# Patient Record
Sex: Female | Born: 1940 | Race: Black or African American | Hispanic: No | Smoking: Never smoker
Health system: Southern US, Community
[De-identification: ages and names within clinical notes are randomized; demographics above are authoritative.]

## PROBLEM LIST (undated history)

## (undated) DIAGNOSIS — E7800 Pure hypercholesterolemia, unspecified: Secondary | ICD-10-CM

## (undated) DIAGNOSIS — I10 Essential (primary) hypertension: Secondary | ICD-10-CM

## (undated) DIAGNOSIS — Z800 Family history of malignant neoplasm of digestive organs: Secondary | ICD-10-CM

## (undated) DIAGNOSIS — R011 Cardiac murmur, unspecified: Secondary | ICD-10-CM

## (undated) HISTORY — DX: Cardiac murmur, unspecified: R01.1

## (undated) HISTORY — PX: NO PAST SURGERIES: SHX2092

## (undated) HISTORY — DX: Essential (primary) hypertension: I10

## (undated) HISTORY — DX: Family history of malignant neoplasm of digestive organs: Z80.0

---

## 2007-10-11 ENCOUNTER — Emergency Department (HOSPITAL_COMMUNITY): Admission: EM | Admit: 2007-10-11 | Discharge: 2007-10-11 | Payer: Self-pay | Admitting: Emergency Medicine

## 2008-10-18 ENCOUNTER — Ambulatory Visit: Payer: Self-pay | Admitting: Occupational Medicine

## 2013-07-11 LAB — LIPID PANEL
CHOLESTEROL: 267 mg/dL — AB (ref 0–200)
HDL: 107 mg/dL — AB (ref 35–70)
LDL Cholesterol: 152 mg/dL
Triglycerides: 42 mg/dL (ref 40–160)

## 2013-07-11 LAB — BASIC METABOLIC PANEL
Creatinine: 1.1 mg/dL (ref 0.5–1.1)
Glucose: 82 mg/dL
Sodium: 4 mmol/L — AB (ref 137–147)

## 2013-07-11 LAB — HEPATIC FUNCTION PANEL
ALT: 20 U/L (ref 7–35)
AST: 21 U/L (ref 13–35)

## 2013-07-11 LAB — DIET LOW PHOSPHORUS
Phosphorus: 3.5 mg/dL (ref 2.5–4.9)
Vit D, 25-Hydroxy: 22.69

## 2013-07-11 LAB — TSH: TSH: 1.72 u[IU]/mL (ref 0.41–5.90)

## 2013-07-11 LAB — CBC AND DIFFERENTIAL
HEMOGLOBIN: 13.5 g/dL (ref 12.0–16.0)
Platelets: 291 10*3/uL (ref 150–399)
WBC: 4.4 10^3/mL

## 2013-07-11 LAB — HEMOGLOBIN A1C: Hgb A1c MFr Bld: 6.1 % — AB (ref 4.0–6.0)

## 2013-08-18 ENCOUNTER — Ambulatory Visit (INDEPENDENT_AMBULATORY_CARE_PROVIDER_SITE_OTHER): Payer: Federal, State, Local not specified - PPO | Admitting: Family Medicine

## 2013-08-18 ENCOUNTER — Encounter: Payer: Self-pay | Admitting: Family Medicine

## 2013-08-18 VITALS — BP 141/64 | HR 78 | Ht 67.0 in | Wt 198.0 lb

## 2013-08-18 DIAGNOSIS — R7303 Prediabetes: Secondary | ICD-10-CM | POA: Insufficient documentation

## 2013-08-18 DIAGNOSIS — E785 Hyperlipidemia, unspecified: Secondary | ICD-10-CM | POA: Insufficient documentation

## 2013-08-18 DIAGNOSIS — I10 Essential (primary) hypertension: Secondary | ICD-10-CM

## 2013-08-18 DIAGNOSIS — N183 Chronic kidney disease, stage 3 unspecified: Secondary | ICD-10-CM | POA: Insufficient documentation

## 2013-08-18 DIAGNOSIS — E039 Hypothyroidism, unspecified: Secondary | ICD-10-CM | POA: Insufficient documentation

## 2013-08-18 DIAGNOSIS — R7309 Other abnormal glucose: Secondary | ICD-10-CM

## 2013-08-18 HISTORY — DX: Essential (primary) hypertension: I10

## 2013-08-18 MED ORDER — LISINOPRIL 20 MG PO TABS: ORAL_TABLET | ORAL | Status: DC

## 2013-08-18 MED ORDER — PRAVASTATIN SODIUM 40 MG PO TABS: 40.0000 mg | ORAL_TABLET | Freq: Every evening | ORAL | Status: DC

## 2013-08-18 NOTE — Progress Notes (Signed)
CC: Felicia Yu is a 73 y.o. female is here for Establish Care   Subjective: HPI:  Pleasant and animated 73-year-old here to establish care  Patient reports a history of stage III chronic kidney disease that was brought to her attention in December of last year. She asked her physician if she should start on a medication such as lisinopril to help control this and believes her former physician dismissed her concern.  She denies any intolerance to ACE inhibitors or angiotensin receptor blockers. She had a metabolic panel obtained in December confirming stage III kidney disease with a normal potassium. Phosphorus was checked and was normal.  Intact parathyroid hormone was slightly elevated. Calcium was normal as were all other electrolytes.  She reports a history of hypertension she's unsure how long she's had this condition but she is taking amlodipine 10 mg on a daily basis. She denies any known side effects.  She reports a history of hyperlipidemia and has been taking lovastatin on a daily basis without right upper quadrant pain nor myalgias. She had a lipid panel obtained on December showing an LDL of 152. She exercises most days of the week and tries to watch what she eats. She wants note there is a different statin if she can take which has low risk of side effects  She reports a history of hypothyroidism taking 50 mcg of levothyroxine on a daily basis she has free T4, free T3, TSH obtained in December all of which were normal.  She brings in labs show an A1c that was obtained in December at a level of 6.1. She denies polyuria polyphasia or polydipsia nor ever being told she was a diabetic.   Review of Systems - General ROS: negative for - chills, fever, night sweats, weight gain or weight loss Ophthalmic ROS: negative for - decreased vision Psychological ROS: negative for - anxiety or depression ENT ROS: negative for - hearing change, nasal congestion, tinnitus or  allergies Hematological and Lymphatic ROS: negative for - bleeding problems, bruising or swollen lymph nodes Breast ROS: negative Respiratory ROS: no cough, shortness of breath, or wheezing Cardiovascular ROS: no chest pain or dyspnea on exertion Gastrointestinal ROS: no abdominal pain, change in bowel habits, or black or bloody stools Genito-Urinary ROS: negative for - genital discharge, genital ulcers, incontinence or abnormal bleeding from genitals Musculoskeletal ROS: negative for - joint pain or muscle pain Neurological ROS: negative for - headaches or memory loss Dermatological ROS: negative for lumps, mole changes, rash and skin lesion changes  Past Medical History  Diagnosis Date  . Essential hypertension, benign 08/18/2013     History reviewed. No pertinent family history.   History  Substance Use Topics  . Smoking status: Not on file  . Smokeless tobacco: Not on file  . Alcohol Use: Not on file     Objective: Filed Vitals:   08/18/13 1413  BP: 141/64  Pulse: 78    General: Alert and Oriented, No Acute Distress HEENT: Pupils equal, round, reactive to light. Conjunctivae clear.  Moist mucous membranes pharynx unremarkable Lungs: Clear and comfortable work of breathing Cardiac: Regular rate and rhythm.  Neuro: Cranial nerves 2-12 grossly intact Extremities: No peripheral edema.  Strong peripheral pulses.  Mental Status: No depression, anxiety, nor agitation. Skin: Warm and dry.  Assessment & Plan: Felicia was seen today for establish care.  Diagnoses and associated orders for this visit:  Essential hypertension, benign - lisinopril (PRINIVIL,ZESTRIL) 20 MG tablet; One tablet by mouth daily for blood  pressure control.  CKD (chronic kidney disease) stage 3, GFR 30-59 ml/min - lisinopril (PRINIVIL,ZESTRIL) 20 MG tablet; One tablet by mouth daily for blood pressure control.  Hyperlipidemia - pravastatin (PRAVACHOL) 40 MG tablet; Take 1 tablet (40 mg total) by  mouth every evening.  Prediabetes  Hypothyroid    Essential hypertension: Uncontrolled we will add lisinopril, I've asked her to continue on amlodipine however she politely states that she would rather see if lisinopril alone can control her blood pressure  Chronic kidney disease: I think would be a great idea for her to be on lisinopril as long as it does not acutely impair her kidney function or cause hyperkalemia, starting 20 mg return in 3-4 weeks for repeat blood pressure and for metabolic panel Hyperlipidemia: Uncontrolled, discussed the pravastatin would have lowest chance of myalgias therefore start today stop lovastatin rechecking lipid panel 3 months Prediabetes: We'll check A1c on a three-month basis due in 2 months Hypothyroidism: Controlled continue levothyroxine   Return in about 4 weeks (around 09/15/2013) for BP Follow up.

## 2013-08-19 ENCOUNTER — Encounter: Payer: Self-pay | Admitting: *Deleted

## 2013-09-15 ENCOUNTER — Ambulatory Visit: Payer: Federal, State, Local not specified - PPO | Admitting: Family Medicine

## 2013-09-29 ENCOUNTER — Ambulatory Visit: Payer: Federal, State, Local not specified - PPO | Admitting: Family Medicine

## 2015-02-09 ENCOUNTER — Telehealth: Payer: Self-pay | Admitting: Family Medicine

## 2015-02-09 NOTE — Telephone Encounter (Signed)
Patient needs refill on lisinopril 20 mg.  She has appt next week on 7/15 with Dr Ivan Anchorshommel for follow up.  Can you send in a refill till she can come in?  Thanks  [facility] [address] in [place]

## 2015-02-10 NOTE — Telephone Encounter (Signed)
Pt has not been seen in a year so she would need an appt. Left message on vm

## 2015-02-19 ENCOUNTER — Encounter: Payer: Self-pay | Admitting: Family Medicine

## 2015-02-19 ENCOUNTER — Ambulatory Visit (INDEPENDENT_AMBULATORY_CARE_PROVIDER_SITE_OTHER): Payer: Federal, State, Local not specified - PPO | Admitting: Family Medicine

## 2015-02-19 VITALS — BP 162/77 | HR 69 | Wt 195.0 lb

## 2015-02-19 DIAGNOSIS — E039 Hypothyroidism, unspecified: Secondary | ICD-10-CM

## 2015-02-19 DIAGNOSIS — R7309 Other abnormal glucose: Secondary | ICD-10-CM

## 2015-02-19 DIAGNOSIS — I10 Essential (primary) hypertension: Secondary | ICD-10-CM | POA: Diagnosis not present

## 2015-02-19 DIAGNOSIS — R7303 Prediabetes: Secondary | ICD-10-CM

## 2015-02-19 DIAGNOSIS — N183 Chronic kidney disease, stage 3 unspecified: Secondary | ICD-10-CM

## 2015-02-19 NOTE — Progress Notes (Signed)
CC: Felicia Yu is a 74 y.o. female is here for Hypertension and Hyperlipidemia   Subjective: HPI:  Follow-up essential hypertension: She is currently taking both amlodipine and lisinopril. Since starting lisinopril she denies any cough or angioedema nor any other known side effects. Denies chest pain shortness of breath nor peripheral edema. No outside blood pressures to report  Follow-up chronic kidney disease: Denies any edema or muscle cramping nor confusion.  Follow-up hypothyroidism: Currently on 50 g of levothyroxine on a daily basis. No unintentional weight loss or gain. No cast or intestinal skin or hair complaints  Follow-up prediabetes: No outside blood sugars to report. Denies polyuria polyphagia or polydipsia. Admits she could do better with physical activity but tries to watch what she eats   Review Of Systems Outlined In HPI  Past Medical History  Diagnosis Date  . Essential hypertension, benign 08/18/2013    No past surgical history on file. Family History  Problem Relation Age of Onset  . Diabetes    . Hyperlipidemia    . Hypertension      History   Social History  . Marital Status: Divorced    Spouse Name: N/A  . Number of Children: N/A  . Years of Education: N/A   Occupational History  . Not on file.   Social History Main Topics  . Smoking status: Never Smoker   . Smokeless tobacco: Not on file  . Alcohol Use: No  . Drug Use: No  . Sexual Activity: No   Other Topics Concern  . Not on file   Social History Narrative     Objective: BP 162/77 mmHg  Pulse 69  Wt 195 lb (88.451 kg)  General: Alert and Oriented, No Acute Distress HEENT: Pupils equal, round, reactive to light. Conjunctivae clear. Moist mucous membranes Lungs: Clear to auscultation bilaterally, no wheezing/ronchi/rales.  Comfortable work of breathing. Good air movement. Cardiac: Regular rate and rhythm. Normal S1/S2.  No murmurs, rubs, nor gallops.   Abdomen: Obese and  soft Extremities: No peripheral edema.  Strong peripheral pulses.  Mental Status: No depression, anxiety, nor agitation. Skin: Warm and dry.  Assessment & Plan: Felicia was seen today for hypertension and hyperlipidemia.  Diagnoses and all orders for this visit:  Essential hypertension, benign Orders: -     Lipid panel  CKD (chronic kidney disease) stage 3, GFR 30-59 ml/min Orders: -     BASIC METABOLIC PANEL WITH GFR  Hypothyroidism, unspecified hypothyroidism type Orders: -     TSH  Prediabetes Orders: -     Hemoglobin A1c   Essential hypertension: Uncontrolled chronic condition, I would like to go up on lisinopril however I like to measure her renal function first. Chronic kidney disease: Checking renal function and electrolytes Hypothyroidism: Clinically controlled but due for TSH , continue daily levothyroxine pending results Prediabetes: Clinically controlled to for repeat A1c   Return in about 3 months (around 05/22/2015).

## 2015-02-20 LAB — HEMOGLOBIN A1C
Hgb A1c MFr Bld: 5.7 % — ABNORMAL HIGH (ref <5.7)
Mean Plasma Glucose: 117 mg/dL — ABNORMAL HIGH (ref <117)

## 2015-02-20 LAB — TSH: TSH: 2.738 u[IU]/mL (ref 0.350–4.500)

## 2015-02-20 LAB — BASIC METABOLIC PANEL WITH GFR
BUN: 17 mg/dL (ref 6–23)
CO2: 29 mEq/L (ref 19–32)
CREATININE: 1.02 mg/dL (ref 0.50–1.10)
Calcium: 9 mg/dL (ref 8.4–10.5)
Chloride: 102 mEq/L (ref 96–112)
GFR, EST NON AFRICAN AMERICAN: 55 mL/min — AB
GFR, Est African American: 64 mL/min
Glucose, Bld: 81 mg/dL (ref 70–99)
POTASSIUM: 4.1 meq/L (ref 3.5–5.3)
Sodium: 139 mEq/L (ref 135–145)

## 2015-02-20 LAB — LIPID PANEL
CHOL/HDL RATIO: 2.7 ratio
CHOLESTEROL: 254 mg/dL — AB (ref 0–200)
HDL: 93 mg/dL (ref >=46)
LDL CALC: 149 mg/dL — AB (ref 0–99)
Triglycerides: 62 mg/dL (ref <150)
VLDL: 12 mg/dL (ref 0–40)

## 2015-02-22 ENCOUNTER — Telehealth: Payer: Self-pay | Admitting: Family Medicine

## 2015-02-22 DIAGNOSIS — N183 Chronic kidney disease, stage 3 unspecified: Secondary | ICD-10-CM

## 2015-02-22 DIAGNOSIS — I10 Essential (primary) hypertension: Secondary | ICD-10-CM

## 2015-02-22 MED ORDER — LEVOTHYROXINE SODIUM 50 MCG PO TABS: 50.0000 ug | ORAL_TABLET | Freq: Every day | ORAL | Status: DC

## 2015-02-22 MED ORDER — ATORVASTATIN CALCIUM 20 MG PO TABS: 20.0000 mg | ORAL_TABLET | Freq: Every day | ORAL | Status: DC

## 2015-02-22 MED ORDER — LISINOPRIL 30 MG PO TABS: ORAL_TABLET | ORAL | Status: DC

## 2015-02-22 NOTE — Telephone Encounter (Signed)
Left message on vm

## 2015-02-22 NOTE — Telephone Encounter (Signed)
Felicia Yu, Will you please let patient know that her thyroid supplement appears to be adequate therefore I've sent in refills of her levothyroxine to [facility].  Also Her A1c remains in the prediabetic range but improved compared to last year.  Her cholesterol was elevated to a degree where I'd strongly recommend switching from pravastatin to atorvastatin.  A higher dose of lisinopril was also sent in.  I'd recommend f/u in one month.

## 2015-03-10 ENCOUNTER — Ambulatory Visit (INDEPENDENT_AMBULATORY_CARE_PROVIDER_SITE_OTHER): Payer: Federal, State, Local not specified - PPO | Admitting: Family Medicine

## 2015-03-10 ENCOUNTER — Encounter: Payer: Self-pay | Admitting: Family Medicine

## 2015-03-10 VITALS — BP 167/75 | HR 74 | Ht 67.0 in | Wt 179.0 lb

## 2015-03-10 DIAGNOSIS — K130 Diseases of lips: Secondary | ICD-10-CM

## 2015-03-10 DIAGNOSIS — R202 Paresthesia of skin: Secondary | ICD-10-CM

## 2015-03-10 DIAGNOSIS — R220 Localized swelling, mass and lump, head: Secondary | ICD-10-CM | POA: Insufficient documentation

## 2015-03-10 NOTE — Assessment & Plan Note (Signed)
Left lower lip swelling. Unclear etiology. Patient does take an ACE inhibitor this could possibly be angioedema. As patient is going to the emergency room anyway this issue can be followed.

## 2015-03-10 NOTE — Progress Notes (Signed)
Felicia Yu is a 74 y.o. female who presents to [facility]  today for "stroke".  Patient awoke this afternoon with swelling of her left lower lip, subjective blurry vision in her left eye, left arm feeling cold and numb, and left facial and head decreased sensation compared to the right. She notes that her sister had a stroke 2 weeks ago and she is concerned that she is having a stroke today. She denies any weakness or loss of function or trouble with speech or swallow. She denies any history of stroke in her personal medical history. No vomiting diarrhea chest pains or palpitations.   Past Medical History  Diagnosis Date  . Essential hypertension, benign 08/18/2013   No past surgical history on file. History  Substance Use Topics  . Smoking status: Never Smoker   . Smokeless tobacco: Not on file  . Alcohol Use: No   ROS as above Medications: Current Outpatient Prescriptions  Medication Sig Dispense Refill  . amLODipine (NORVASC) 5 MG tablet Take 5 mg by mouth daily.    Marland Kitchen atorvastatin (LIPITOR) 20 MG tablet Take 1 tablet (20 mg total) by mouth daily. 90 tablet 1  . levothyroxine (SYNTHROID, LEVOTHROID) 50 MCG tablet Take 1 tablet (50 mcg total) by mouth daily before breakfast. 90 tablet 1  . lisinopril (PRINIVIL,ZESTRIL) 30 MG tablet One tablet by mouth daily for blood pressure control. 30 tablet 2   No current facility-administered medications for this visit.   No Known Allergies   Exam:  BP 167/75 mmHg  Pulse 74  Ht  (1.702 m)  Wt 179 lb (81.194 kg)  BMI 28.03 kg/m2 Gen: Well NAD HEENT: EOMI,  MMM mild left lower lip swelling. No tongue swelling. Lungs: Normal work of breathing. CTABL Heart: RRR no MRG Abd: NABS, Soft. Nondistended, Nontender Exts: Brisk capillary refill, warm and well perfused.  Neuro: Alert and oriented cranial nerves II through XII are intact. Mild subjective decreased sensation left face. Upper extremity  and lower extremity reflexes are equal and normal bilaterally. Normal strength and gait. Psych: Alert and oriented normal affect and speech is slightly pressured. Patient is slightly tangential.  Visual Acuity:  Right Eye Distance:   20/30 Left Eye Distance:   20/70 Bilateral Distance:     No results found for this or any previous visit (from the past 24 hour(s)). No results found.   Please see individual assessment and plan sections.

## 2015-03-10 NOTE — Assessment & Plan Note (Signed)
Patient has symptoms likely are simple paresthesias. However she has risk factors for renovascular disease. I feel is reasonable for her to go to the emergency room for workup for possible TIA. Patient refused EKG and EMS transport. She will drive herself. We warned of the risk for death or disability to herself or to others by driving a car with the possibility of having a current stroke.

## 2015-03-11 ENCOUNTER — Telehealth: Payer: Self-pay | Admitting: *Deleted

## 2015-03-11 MED ORDER — LOSARTAN POTASSIUM 100 MG PO TABS: 100.0000 mg | ORAL_TABLET | Freq: Every day | ORAL | Status: DC

## 2015-03-11 NOTE — Telephone Encounter (Signed)
Pt notified of new rx.

## 2015-03-11 NOTE — Telephone Encounter (Signed)
Pt left vm stating that she was informed in the ED yesterday that all her "stroke" sx were an allergice reaction to her lisinopril.  She would like for you to call her something else in to her pharm.

## 2015-03-11 NOTE — Telephone Encounter (Signed)
New Rx of losartan sent to [facility] in [place].  This replaces lisinopril which should be discontinued.

## 2015-11-22 ENCOUNTER — Other Ambulatory Visit: Payer: Self-pay | Admitting: Family Medicine

## 2017-01-09 ENCOUNTER — Encounter (HOSPITAL_BASED_OUTPATIENT_CLINIC_OR_DEPARTMENT_OTHER): Payer: Self-pay | Admitting: *Deleted

## 2017-01-09 ENCOUNTER — Emergency Department (HOSPITAL_BASED_OUTPATIENT_CLINIC_OR_DEPARTMENT_OTHER)
Admission: EM | Admit: 2017-01-09 | Discharge: 2017-01-09 | Disposition: A | Discharge status: Home / Self Care | Payer: Federal, State, Local not specified - PPO | Attending: Physician Assistant | Admitting: Physician Assistant

## 2017-01-09 DIAGNOSIS — I10 Essential (primary) hypertension: Secondary | ICD-10-CM | POA: Diagnosis not present

## 2017-01-09 DIAGNOSIS — Z79899 Other long term (current) drug therapy: Secondary | ICD-10-CM | POA: Diagnosis not present

## 2017-01-09 HISTORY — DX: Pure hypercholesterolemia, unspecified: E78.00

## 2017-01-09 LAB — CBC WITH DIFFERENTIAL/PLATELET
Basophils Absolute: 0 10*3/uL (ref 0.0–0.1)
Basophils Relative: 0 %
EOS PCT: 1 %
Eosinophils Absolute: 0.1 10*3/uL (ref 0.0–0.7)
HCT: 40.8 % (ref 36.0–46.0)
Hemoglobin: 13.3 g/dL (ref 12.0–15.0)
LYMPHS ABS: 2.6 10*3/uL (ref 0.7–4.0)
Lymphocytes Relative: 45 %
MCH: 28.9 pg (ref 26.0–34.0)
MCHC: 32.6 g/dL (ref 30.0–36.0)
MCV: 88.7 fL (ref 78.0–100.0)
MONOS PCT: 6 %
Monocytes Absolute: 0.3 10*3/uL (ref 0.1–1.0)
Neutro Abs: 2.7 10*3/uL (ref 1.7–7.7)
Neutrophils Relative %: 48 %
PLATELETS: 227 10*3/uL (ref 150–400)
RBC: 4.6 MIL/uL (ref 3.87–5.11)
RDW: 14 % (ref 11.5–15.5)
WBC: 5.7 10*3/uL (ref 4.0–10.5)

## 2017-01-09 LAB — BASIC METABOLIC PANEL
Anion gap: 7 (ref 5–15)
BUN: 18 mg/dL (ref 6–20)
CALCIUM: 9.3 mg/dL (ref 8.9–10.3)
CO2: 28 mmol/L (ref 22–32)
CREATININE: 0.93 mg/dL (ref 0.44–1.00)
Chloride: 106 mmol/L (ref 101–111)
GFR calc Af Amer: >60 mL/min (ref >60)
GFR, EST NON AFRICAN AMERICAN: 59 mL/min — AB (ref >60)
GLUCOSE: 87 mg/dL (ref 65–99)
Potassium: 3.6 mmol/L (ref 3.5–5.1)
SODIUM: 141 mmol/L (ref 135–145)

## 2017-01-09 LAB — TROPONIN I: Troponin I: <0.03 ng/mL (ref <0.03)

## 2017-01-09 NOTE — Discharge Instructions (Signed)
We're reassured by her normal vital signs normal labs. Please return as needed. Please follow-up with her primary care physician.

## 2017-01-09 NOTE — ED Provider Notes (Signed)
[facility] Note   CSN: [id number] Arrival date & time: 01/09/17  1921  By signing my name below, I, Diona BrownerJennifer Gorman, attest that this documentation has been prepared under the direction and in the presence of Jaimere Feutz, Cindee Saltourteney Lyn, MD. Electronically Signed: Diona BrownerJennifer Gorman, ED Scribe. 01/09/17. 8:13 PM.  History   Chief Complaint Chief Complaint  Patient presents with  . Hypertension    HPI Felicia Yu is a 76 y.o. female with a PMHx of HTN who presents to the Emergency Department complaining of constant elevated BP for the last week. She goes to [facility] regularly to check her BP, since [place] P[place] in [place] used [place] live. Pt notes she thought she had a moment of tingling in her right leg. She goes on walks regularly. FHx of heart disease. Pt denies fever, bilateral leg edema, and CP.  The history is provided by the patient. No language interpreter was used.    Past Medical History:  Diagnosis Date  . High cholesterol   . Hypertension     There are no active problems to display for this patient.   History reviewed. No pertinent surgical history.  OB History    No data available       Home Medications    Prior to Admission medications   Medication Sig Start Date End Date Taking? Authorizing Provider  atorvastatin (LIPITOR) 20 MG tablet Take 20 mg by mouth daily.   Yes [provider]    Family History No family history on file.  Social History Social History  Substance Use Topics  . Smoking status: Never Smoker  . Smokeless tobacco: Never Used  . Alcohol use No     Allergies   Patient has no known allergies.   Review of Systems Review of Systems  Constitutional: Negative for fever.  Cardiovascular: Negative for chest pain.  All other systems reviewed and are negative.    Physical Exam Updated Vital Signs BP (!) 192/96   Pulse 78   Temp 98.7 F (37.1 C) (Oral)   Resp 20   Ht 5\' 4"  (1.626 m)    Wt 180 lb (81.6 kg)   SpO2 100%   BMI 30.90 kg/m   Physical Exam  Constitutional: She is oriented to person, place, and time. She appears well-developed and well-nourished.  HENT:  Head: Normocephalic and atraumatic.  Eyes: Conjunctivae and EOM are normal. Pupils are equal, round, and reactive to light.  Neck: Normal range of motion.  Cardiovascular: Normal rate, regular rhythm, normal heart sounds and intact distal pulses.   Pulmonary/Chest: Effort normal and breath sounds normal.  Abdominal: Soft. Bowel sounds are normal. She exhibits no distension.  Musculoskeletal: Normal range of motion. She exhibits no edema.  Neurological: She is alert and oriented to person, place, and time.  Skin: Skin is warm and dry.  Psychiatric: She has a normal mood and affect.  Nursing note and vitals reviewed.    ED Treatments / Results  DIAGNOSTIC STUDIES: Oxygen Saturation is 100% on RA, normal by my interpretation.   COORDINATION OF CARE: 8:13 PM-Discussed next steps with pt which includes following up with a local PCP. Pt verbalized understanding and is agreeable with the plan.   Labs (all labs ordered are listed, but only abnormal results are displayed) Labs Reviewed  CBC WITH DIFFERENTIAL/PLATELET  TROPONIN I  BASIC METABOLIC PANEL    EKG  EKG Interpretation  Date/Time:  Tuesday January 09 2017 19:40:54 EDT Ventricular Rate:  76 PR Interval:    QRS Duration: 87 QT Interval:  382 QTC Calculation: 430 R Axis:   42 Text Interpretation:  Sinus rhythm Baseline wander in lead(s) V4 Normal sinus rhythm Confirmed by Corlis Leak, Haeden Hudock ([id number]) on 01/09/2017 8:03:12 PM       Radiology No results found.  Procedures Procedures (including critical care time)  Medications Ordered in ED Medications - No data to display   Initial Impression / Assessment and Plan / ED Course  I have reviewed the triage vital signs and the nursing notes.  Pertinent labs & imaging results that were  available during my care of the patient were reviewed by me and considered in my medical decision making (see chart for details).     I personally performed the services described in this documentation, which was scribed in my presence. The recorded information has been reviewed and is accurate.   Very well-appearing 76 year old female with history of hypertension hyperlipidemia presenting with hypertension. Patient has been concerned because she's been taking her blood pressure at [facility] and it's been elevated. Patient had no chest pain. We discussed the need to follow-up with a primary care physician in the area. We'll give her follow-up information. Patient's physical exam and labs are reassuring.    Final Clinical Impressions(s) / ED Diagnoses   Final diagnoses:  None    New Prescriptions New Prescriptions   No medications on file     Abelino Derrick, MD 01/09/17 2028

## 2017-01-09 NOTE — ED Triage Notes (Signed)
States she was Felicia Yu and took her BP. It was elevated. Tingling in her right leg x 20 minutes.

## 2017-01-18 ENCOUNTER — Encounter: Payer: Self-pay | Admitting: Family Medicine

## 2017-01-18 ENCOUNTER — Ambulatory Visit (INDEPENDENT_AMBULATORY_CARE_PROVIDER_SITE_OTHER): Payer: Federal, State, Local not specified - PPO | Admitting: Family Medicine

## 2017-01-18 VITALS — BP 200/90 | HR 73 | Temp 98.0°F | Ht 64.0 in | Wt 189.6 lb

## 2017-01-18 DIAGNOSIS — R7303 Prediabetes: Secondary | ICD-10-CM | POA: Diagnosis not present

## 2017-01-18 DIAGNOSIS — I10 Essential (primary) hypertension: Secondary | ICD-10-CM | POA: Diagnosis not present

## 2017-01-18 DIAGNOSIS — E039 Hypothyroidism, unspecified: Secondary | ICD-10-CM | POA: Diagnosis not present

## 2017-01-18 DIAGNOSIS — R011 Cardiac murmur, unspecified: Secondary | ICD-10-CM

## 2017-01-18 DIAGNOSIS — E785 Hyperlipidemia, unspecified: Secondary | ICD-10-CM

## 2017-01-18 HISTORY — DX: Cardiac murmur, unspecified: R01.1

## 2017-01-18 LAB — COMPREHENSIVE METABOLIC PANEL
ALT: 11 U/L (ref 0–35)
AST: 21 U/L (ref 0–37)
Albumin: 3.8 g/dL (ref 3.5–5.2)
Alkaline Phosphatase: 72 U/L (ref 39–117)
BUN: 15 mg/dL (ref 6–23)
CALCIUM: 9.2 mg/dL (ref 8.4–10.5)
CHLORIDE: 106 meq/L (ref 96–112)
CO2: 30 meq/L (ref 19–32)
Creatinine, Ser: 1.05 mg/dL (ref 0.40–1.20)
GFR: 65.82 mL/min (ref >60.00)
GLUCOSE: 88 mg/dL (ref 70–99)
Potassium: 3.6 mEq/L (ref 3.5–5.1)
Sodium: 142 mEq/L (ref 135–145)
Total Bilirubin: 0.6 mg/dL (ref 0.2–1.2)
Total Protein: 6.7 g/dL (ref 6.0–8.3)

## 2017-01-18 LAB — TSH: TSH: 2.85 u[IU]/mL (ref 0.35–4.50)

## 2017-01-18 LAB — LIPID PANEL
CHOL/HDL RATIO: 2
Cholesterol: 197 mg/dL (ref 0–200)
HDL: 87.4 mg/dL (ref >39.00)
LDL CALC: 100 mg/dL — AB (ref 0–99)
NONHDL: 109.95
TRIGLYCERIDES: 51 mg/dL (ref 0.0–149.0)
VLDL: 10.2 mg/dL (ref 0.0–40.0)

## 2017-01-18 LAB — CBC
HEMATOCRIT: 38.5 % (ref 36.0–46.0)
HEMOGLOBIN: 12.5 g/dL (ref 12.0–15.0)
MCHC: 32.6 g/dL (ref 30.0–36.0)
MCV: 89 fl (ref 78.0–100.0)
PLATELETS: 228 10*3/uL (ref 150.0–400.0)
RBC: 4.32 Mil/uL (ref 3.87–5.11)
RDW: 15.3 % (ref 11.5–15.5)
WBC: 3.7 10*3/uL — ABNORMAL LOW (ref 4.0–10.5)

## 2017-01-18 LAB — T4, FREE: FREE T4: 0.71 ng/dL (ref 0.60–1.60)

## 2017-01-18 LAB — HEMOGLOBIN A1C: Hgb A1c MFr Bld: 5.6 % (ref 4.6–6.5)

## 2017-01-18 MED ORDER — AMLODIPINE BESYLATE 5 MG PO TABS: 5.0000 mg | ORAL_TABLET | Freq: Every day | ORAL | 5 refills | Status: DC

## 2017-01-18 NOTE — Progress Notes (Signed)
Chief Complaint  Patient presents with  . Establish Care    pt want to discuss BP meds       New Patient Visit SUBJECTIVE: HPI: Felicia Yu is an 76 y.o.female who is being seen for establishing care.  The patient was previously seen at Dr. Genelle Bal office before he left.  Hypertension Patient presents for hypertension follow up. She does not routinely monitor home blood pressures. She is not compliant with medications- Cozaar 50 mg daily instead of 100 mg daily. She used to be on Norvasc, and does not remember why she stopped taking it.  Patient has these side effects of medication: none She is adhering to a healthy diet overall. Exercise: walking daily Denies chest pain, palpitations, or SOB. She does not snore at night and denies daytime fatigue.  Allergies  Allergen Reactions  . Lisinopril Other (See Comments)    Facial edema & blurred vision    Past Medical History:  Diagnosis Date  . Essential hypertension, benign 08/18/2013  . Systolic ejection murmur 01/18/2017   Past Surgical History:  Procedure Laterality Date  . NO PAST SURGERIES     Social History   Social History  . Marital status: Divorced   Social History Main Topics  . Smoking status: Never Smoker  . Smokeless tobacco: Never Used  . Alcohol use No  . Drug use: No  . Sexual activity: No   Family History  Problem Relation Age of Onset  . Diabetes Unknown   . Hyperlipidemia Unknown   . Hypertension Unknown      Current Outpatient Prescriptions:  .  atorvastatin (LIPITOR) 80 MG tablet, Take 80 mg by mouth daily., Disp: , Rfl:  .  levothyroxine (SYNTHROID, LEVOTHROID) 50 MCG tablet, Take 1 tablet (50 mcg total) by mouth daily before breakfast., Disp: 90 tablet, Rfl: 1 .  losartan (COZAAR) 100 MG tablet, Take 1 tablet (100 mg total) by mouth daily., Disp: 90 tablet, Rfl: 0 .  OVER THE COUNTER MEDICATION, Vitamin D3 800mg -Take 1 tablet by mouth twice a day., Disp: , Rfl:  .  amLODipine  (NORVASC) 5 MG tablet, Take 1 tablet (5 mg total) by mouth daily., Disp: 30 tablet, Rfl: 5  No LMP recorded. Patient is postmenopausal.  ROS Const: no fevers Eyes: no vision changes Nose: no nosebleeds Throat: no trouble swallowing Neck: No masses in neck Endo: No unexpected weight changes Heart: No chest pain Lung: No SOB GI: No constipation GU: No urinary complaints Neuro: No numbness or tingling  OBJECTIVE: BP (!) 200/90 (BP Location: Left Arm, Patient Position: Sitting, Cuff Size: Normal) Comment: Patient has not taking BP medication this morning.  Pulse 73   Temp 98 F (36.7 C) (Oral)   Ht 5\' 4"  (1.626 m)   Wt 189 lb 9.6 oz (86 kg)   SpO2 99%   BMI 32.54 kg/m   Constitutional: -  VS reviewed -  Well developed, well nourished, appears stated age -  No apparent distress  Psychiatric: -  Oriented to person, place, and time -  Memory intact -  Affect and mood normal -  Fluent conversation, good eye contact -  Judgment and insight age appropriate  Eye: -  Conjunctivae clear, no discharge -  Pupils symmetric, round, reactive to light  ENMT: -  Oral mucosa without lesions, tongue and uvula midline    Tonsils not enlarged, no erythema, no exudate, trachea midline    Pharynx moist, no lesions, no erythema  Neck: -  No gross  swelling, no palpable masses -  Thyroid midline, not enlarged, mobile, no palpable masses  Cardiovascular: -  RRR, no murmurs -  No LE edema  Respiratory: -  Normal respiratory effort, no accessory muscle use, no retraction -  Breath sounds equal, no wheezes, no ronchi, no crackles  Gastrointestinal: -  Bowel sounds normal -  No tenderness, no distention, no guarding, no masses  Neurological:  -  CN II - XII grossly intact -  Sensation grossly intact to light touch, equal bilaterally  Skin: -  No significant lesion on inspection -  Warm and dry to palpation   ASSESSMENT/PLAN: Essential hypertension, benign - Plan: Comprehensive metabolic panel,  CBC, Aldosterone + renin activity w/ ratio, amLODipine (NORVASC) 5 MG tablet  Hypothyroidism, unspecified type - Plan: TSH, T4, free  Prediabetes - Plan: Hemoglobin A1c  Hyperlipidemia, unspecified hyperlipidemia type - Plan: Lipid panel  Systolic ejection murmur  Patient instructed to sign release of records form from her previous PCP. Check some labs, EKG obtained 2 weeks ago in ED WNL. Go to full dose of losartan, add back amlodipine. Home BP monitoring discussed. Counseled on diet and exercise. Given her concern, 2ndary causes of HTN explored in hx, will obtain renin and aldosterone.  Patient should return in 4 week, bring BP log. The patient voiced understanding and agreement to the plan.   Jilda Rocheicholas Paul DubachWendling, DO 01/18/17  1:13 PM

## 2017-01-18 NOTE — Patient Instructions (Addendum)
Give us 2-3 business days to get the results of your labs back.   Goal weight loss of 1/2 lb per week. Remember you promised me that you will lose 10 lbs by your visit in 4 weeks.  Around 3 times per week, check your blood pressure 4 times per day. Twice in the morning and twice in the evening. The readings should be at least one minute apart. Write down these values and bring them to your next nurse visit/appointment.  When you check your BP, make sure you have been doing something calm/relaxing 5 minutes prior to checking. Both feet should be flat on the floor and you should be sitting. Use your left arm and make sure it is in a relaxed position (on a table), and that the cuff is at the approximate level/height of your heart.

## 2017-01-22 ENCOUNTER — Encounter: Payer: Self-pay | Admitting: *Deleted

## 2017-01-22 LAB — ALDOSTERONE + RENIN ACTIVITY W/ RATIO
ALDO / PRA RATIO: 1.3 ratio (ref 0.9–28.9)
Aldosterone: 3 ng/dL
PRA LC/MS/MS: 2.32 ng/mL/h (ref 0.25–5.82)

## 2017-01-31 ENCOUNTER — Encounter: Payer: Self-pay | Admitting: Family Medicine

## 2017-02-15 ENCOUNTER — Encounter: Payer: Federal, State, Local not specified - PPO | Admitting: Family Medicine

## 2017-03-23 ENCOUNTER — Encounter: Payer: Federal, State, Local not specified - PPO | Admitting: Family Medicine

## 2017-04-06 ENCOUNTER — Encounter: Payer: Self-pay | Admitting: Family Medicine

## 2017-04-06 ENCOUNTER — Ambulatory Visit (INDEPENDENT_AMBULATORY_CARE_PROVIDER_SITE_OTHER): Payer: Federal, State, Local not specified - PPO | Admitting: Family Medicine

## 2017-04-06 VITALS — BP 142/88 | HR 75 | Temp 98.5°F | Ht 64.0 in | Wt 179.0 lb

## 2017-04-06 DIAGNOSIS — M8580 Other specified disorders of bone density and structure, unspecified site: Secondary | ICD-10-CM

## 2017-04-06 DIAGNOSIS — Z2821 Immunization not carried out because of patient refusal: Secondary | ICD-10-CM

## 2017-04-06 DIAGNOSIS — Z0000 Encounter for general adult medical examination without abnormal findings: Secondary | ICD-10-CM | POA: Diagnosis not present

## 2017-04-06 NOTE — Progress Notes (Signed)
Pre visit review using our clinic review tool, if applicable. No additional management support is needed unless otherwise documented below in the visit note. 

## 2017-04-06 NOTE — Patient Instructions (Addendum)
If you do not hear anything about your bone density scan in the next 1-2 weeks, call our office and ask for an update.   Let us know if you need anything.  Pneumococcal Conjugate Vaccine (PCV13) What You Need to Know 1. Why get vaccinated? Vaccination can protect both children and adults from pneumococcal disease. Pneumococcal disease is caused by bacteria that can spread from person to person through close contact. It can cause ear infections, and it can also lead to more serious infections of the:  Lungs (pneumonia),  Blood (bacteremia), and  Covering of the brain and spinal cord (meningitis).  Pneumococcal pneumonia is most common among adults. Pneumococcal meningitis can cause deafness and brain damage, and it kills about 1 child in 10 who get it. Anyone can get pneumococcal disease, but children under 26 years of age and adults 63 years and older, people with certain medical conditions, and cigarette smokers are at the highest risk. Before there was a vaccine, the [place] saw:  more than 700 cases of meningitis,  about 13,000 blood infections,  about 5 million ear infections, and  about 200 deaths  in children under 5 each year from pneumococcal disease. Since vaccine became available, severe pneumococcal disease in these children has fallen by 88%. About 18,000 older adults die of pneumococcal disease each year in the [place]. Treatment of pneumococcal infections with penicillin and other drugs is not as effective as it used to be, because some strains of the disease have become resistant to these drugs. This makes prevention of the disease, through vaccination, even more important. 2. PCV13 vaccine Pneumococcal conjugate vaccine (called PCV13) protects against 13 types of pneumococcal bacteria. PCV13 is routinely given to children at 2, 4, 6, and 90-60 months of age. It is also recommended for children and adults 32 to 28 years of age with certain health conditions,  and for all adults 22 years of age and older. Your doctor can give you details. 3. Some people should not get this vaccine Anyone who has ever had a life-threatening allergic reaction to a dose of this vaccine, to an earlier pneumococcal vaccine called PCV7, or to any vaccine containing diphtheria toxoid (for example, DTaP), should not get PCV13. Anyone with a severe allergy to any component of PCV13 should not get the vaccine. Tell your doctor if the person being vaccinated has any severe allergies. If the person scheduled for vaccination is not feeling well, your healthcare provider might decide to reschedule the shot on another day. 4. Risks of a vaccine reaction With any medicine, including vaccines, there is a chance of reactions. These are usually mild and go away on their own, but serious reactions are also possible. Problems reported following PCV13 varied by age and dose in the series. The most common problems reported among children were:  About half became drowsy after the shot, had a temporary loss of appetite, or had redness or tenderness where the shot was given.  About 1 out of 3 had swelling where the shot was given.  About 1 out of 3 had a mild fever, and about 1 in 20 had a fever over 102.77F.  Up to about 8 out of 10 became fussy or irritable.  Adults have reported pain, redness, and swelling where the shot was given; also mild fever, fatigue, headache, chills, or muscle pain. Young children who get PCV13 along with inactivated flu vaccine at the same time may be at increased risk for seizures caused by fever.  Ask your doctor for more information. Problems that could happen after any vaccine:  People sometimes faint after a medical procedure, including vaccination. Sitting or lying down for about 15 minutes can help prevent fainting, and injuries caused by a fall. Tell your doctor if you feel dizzy, or have vision changes or ringing in the ears.  Some older children and  adults get severe pain in the shoulder and have difficulty moving the arm where a shot was given. This happens very rarely.  Any medication can cause a severe allergic reaction. Such reactions from a vaccine are very rare, estimated at about 1 in a million doses, and would happen within a few minutes to a few hours after the vaccination. As with any medicine, there is a very small chance of a vaccine causing a serious injury or death. The safety of vaccines is always being monitored. For more information, visit: http://floyd.org/ 5. What if there is a serious reaction? What should I look for? Look for anything that concerns you, such as signs of a severe allergic reaction, very high fever, or unusual behavior. Signs of a severe allergic reaction can include hives, swelling of the face and throat, difficulty breathing, a fast heartbeat, dizziness, and weakness-usually within a few minutes to a few hours after the vaccination. What should I do?  If you think it is a severe allergic reaction or other emergency that can't wait, call 9-1-1 or get the person to the nearest hospital. Otherwise, call your doctor.  Reactions should be reported to the Vaccine Adverse Event Reporting System (VAERS). Your doctor should file this report, or you can do it yourself through the VAERS web site at www.vaers.LAgents.no, or by calling 1-831-250-9862. ? VAERS does not give medical advice. 6. The National Vaccine Injury Compensation Program The [employer] Vaccine Injury Compensation Program (VICP) is a federal program that was created to compensate people who may have been injured by certain vaccines. Persons who believe they may have been injured by a vaccine can learn about the program and about filing a claim by calling 1-575-797-5047 or visiting the VICP website at SpiritualWord.at. There is a time limit to file a claim for compensation. 7. How can I learn more?  Ask your healthcare provider.  He or she can give you the vaccine package insert or suggest other sources of information.  Call your local or state health department.  Contact the Centers for Disease Control and Prevention (CDC): ? Call 612-050-0039 (1-800-CDC-INFO) or ? Visit CDC's website at PicCapture.uy Vaccine Information Statement, PCV13 Vaccine (06/11/2014) This information is not intended to replace advice given to you by your health care provider. Make sure you discuss any questions you have with your health care provider. Document Released: 05/21/2006 Document Revised: 04/13/2016 Document Reviewed: 04/13/2016 Elsevier Interactive Patient Education  2017 [employer]. Tdap Vaccine (Tetanus, Diphtheria and Pertussis): What You Need to Know 1. Why get vaccinated? Tetanus, diphtheria and pertussis are very serious diseases. Tdap vaccine can protect [place] from these diseases. And, Tdap vaccine given to pregnant women can protect newborn babies against pertussis. TETANUS (Lockjaw) is rare in the [place] today. It causes painful muscle tightening and stiffness, usually all over the body.  It can lead to tightening of muscles in the head and neck so you can't open your mouth, swallow, or sometimes even breathe. Tetanus kills about 1 out of 10 people who are infected even after receiving the best medical care.  DIPHTHERIA is also rare in the [place] today.  It can cause a thick coating to form in the back of the throat.  It can lead to breathing problems, heart failure, paralysis, and death.  PERTUSSIS (Whooping Cough) causes severe coughing spells, which can cause difficulty breathing, vomiting and disturbed sleep.  It can also lead to weight loss, incontinence, and rib fractures. Up to 2 in 100 adolescents and 5 in 100 adults with pertussis are hospitalized or have complications, which could include pneumonia or death.  These diseases are caused by bacteria. Diphtheria and pertussis are spread from  person to person through secretions from coughing or sneezing. Tetanus enters the body through cuts, scratches, or wounds. Before vaccines, as many as 200,000 cases of diphtheria, 200,000 cases of pertussis, and hundreds of cases of tetanus, were reported in the [place] each year. Since vaccination began, reports of cases for tetanus and diphtheria have dropped by about 99% and for pertussis by about 80%. 2. Tdap vaccine Tdap vaccine can protect adolescents and adults from tetanus, diphtheria, and pertussis. One dose of Tdap is routinely given at age 104 or 62. People who did not get Tdap at that age should get it as soon as possible. Tdap is especially important for healthcare professionals and anyone having close contact with a baby younger than 12 months. Pregnant women should get a dose of Tdap during every pregnancy, to protect the newborn from pertussis. Infants are most at risk for severe, life-threatening complications from pertussis. Another vaccine, called Td, protects against tetanus and diphtheria, but not pertussis. A Td booster should be given every 10 years. Tdap may be given as one of these boosters if you have never gotten Tdap before. Tdap may also be given after a severe cut or burn to prevent tetanus infection. Your doctor or the person giving you the vaccine can give you more information. Tdap may safely be given at the same time as other vaccines. 3. Some people should not get this vaccine  A person who has ever had a life-threatening allergic reaction after a previous dose of any diphtheria, tetanus or pertussis containing vaccine, OR has a severe allergy to any part of this vaccine, should not get Tdap vaccine. Tell the person giving the vaccine about any severe allergies.  Anyone who had coma or long repeated seizures within 7 days after a childhood dose of DTP or DTaP, or a previous dose of Tdap, should not get Tdap, unless a cause other than the vaccine was found. They  can still get Td.  Talk to your doctor if you: ? have seizures or another nervous system problem, ? had severe pain or swelling after any vaccine containing diphtheria, tetanus or pertussis, ? ever had a condition called Guillain-Barr Syndrome (GBS), ? aren't feeling well on the day the shot is scheduled. 4. Risks With any medicine, including vaccines, there is a chance of side effects. These are usually mild and go away on their own. Serious reactions are also possible but are rare. Most people who get Tdap vaccine do not have any problems with it. Mild problems following Tdap: (Did not interfere with activities)  Pain where the shot was given (about 3 in 4 adolescents or 2 in 3 adults)  Redness or swelling where the shot was given (about 1 person in 5)  Mild fever of at least 100.40F (up to about 1 in 25 adolescents or 1 in 100 adults)  Headache (about 3 or 4 people in 10)  Tiredness (about 1 person in 3 or 4)  Nausea, vomiting, diarrhea, stomach ache (up to 1 in 4 adolescents or 1 in 10 adults)  Chills, sore joints (about 1 person in 10)  Body aches (about 1 person in 3 or 4)  Rash, swollen glands (uncommon)  Moderate problems following Tdap: (Interfered with activities, but did not require medical attention)  Pain where the shot was given (up to 1 in 5 or 6)  Redness or swelling where the shot was given (up to about 1 in 16 adolescents or 1 in 12 adults)  Fever over 102F (about 1 in 100 adolescents or 1 in 250 adults)  Headache (about 1 in 7 adolescents or 1 in 10 adults)  Nausea, vomiting, diarrhea, stomach ache (up to 1 or 3 people in 100)  Swelling of the entire arm where the shot was given (up to about 1 in 500).  Severe problems following Tdap: (Unable to perform usual activities; required medical attention)  Swelling, severe pain, bleeding and redness in the arm where the shot was given (rare).  Problems that could happen after any vaccine:  People  sometimes faint after a medical procedure, including vaccination. Sitting or lying down for about 15 minutes can help prevent fainting, and injuries caused by a fall. Tell your doctor if you feel dizzy, or have vision changes or ringing in the ears.  Some people get severe pain in the shoulder and have difficulty moving the arm where a shot was given. This happens very rarely.  Any medication can cause a severe allergic reaction. Such reactions from a vaccine are very rare, estimated at fewer than 1 in a million doses, and would happen within a few minutes to a few hours after the vaccination. As with any medicine, there is a very remote chance of a vaccine causing a serious injury or death. The safety of vaccines is always being monitored. For more information, visit: http://floyd.org/ 5. What if there is a serious problem? What should I look for? Look for anything that concerns you, such as signs of a severe allergic reaction, very high fever, or unusual behavior. Signs of a severe allergic reaction can include hives, swelling of the face and throat, difficulty breathing, a fast heartbeat, dizziness, and weakness. These would usually start a few minutes to a few hours after the vaccination. What should I do?  If you think it is a severe allergic reaction or other emergency that can't wait, call 9-1-1 or get the person to the nearest hospital. Otherwise, call your doctor.  Afterward, the reaction should be reported to the Vaccine Adverse Event Reporting System (VAERS). Your doctor might file this report, or you can do it yourself through the VAERS web site at www.vaers.LAgents.no, or by calling 1-343-021-3125. ? VAERS does not give medical advice. 6. The National Vaccine Injury Compensation Program The [employer] Vaccine Injury Compensation Program (VICP) is a federal program that was created to compensate people who may have been injured by certain vaccines. Persons who believe they may have  been injured by a vaccine can learn about the program and about filing a claim by calling 1-646-120-1772 or visiting the VICP website at SpiritualWord.at. There is a time limit to file a claim for compensation. 7. How can I learn more?  Ask your doctor. He or she can give you the vaccine package insert or suggest other sources of information.  Call your local or state health department.  Contact the Centers for Disease Control and Prevention (CDC): ? Call 870-338-6902 (1-800-CDC-INFO) or ? Visit CDC's  website at PicCapture.uywww.cdc.gov/vaccines CDC Tdap Vaccine VIS (09/30/13) This information is not intended to replace advice given to you by your health care provider. Make sure you discuss any questions you have with your health care provider. Document Released: 01/23/2012 Document Revised: 04/13/2016 Document Reviewed: 04/13/2016 Elsevier Interactive Patient Education  2017 [employer]

## 2017-04-06 NOTE — Progress Notes (Addendum)
Chief Complaint  Patient presents with  . Annual Exam     Well Woman Felicia Yu is here for a complete physical.   Her last physical was >1 year ago.  Current diet: in general, a "healthy" diet. Current exercise: walking. Weight is stable and she denies daytime fatigue. No LMP recorded. Patient is postmenopausal. Seatbelt? Yes  Health Maintenance Colonoscopy- Yes 09/2016 DEXA- Yes 4 years ago Mammogram- Yes 12/2016 Tetanus- No- refuses Pneumonia- No- Refuses  Past Medical History:  Diagnosis Date  . Essential hypertension, benign 08/18/2013  . High cholesterol   . Hypertension   . Systolic ejection murmur 01/18/2017    Past Surgical History:  Procedure Laterality Date  . NO PAST SURGERIES     Medications  Current Outpatient Prescriptions on File Prior to Visit  Medication Sig Dispense Refill  . amLODipine (NORVASC) 5 MG tablet Take 1 tablet (5 mg total) by mouth daily. 30 tablet 5  . atorvastatin (LIPITOR) 20 MG tablet Take 20 mg by mouth daily.    Marland Kitchen. atorvastatin (LIPITOR) 80 MG tablet Take 80 mg by mouth daily.    Marland Kitchen. levothyroxine (SYNTHROID, LEVOTHROID) 50 MCG tablet Take 1 tablet (50 mcg total) by mouth daily before breakfast. 90 tablet 1  . losartan (COZAAR) 100 MG tablet Take 1 tablet (100 mg total) by mouth daily. 90 tablet 0  . OVER THE COUNTER MEDICATION Vitamin D3 800mg -Take 1 tablet by mouth twice a day.     Allergies Allergies  Allergen Reactions  . Lisinopril Other (See Comments)    Facial edema & blurred vision    Review of Systems: Constitutional:  no unexpected change in weight, no weakness, no unexplained fevers, sweats, or chills Eye:  no recent significant change in vision Ear/Nose/Mouth/Throat:  Ears:  no tinnitus or vertigo and no recent change in hearing, Nose/Mouth/Throat:  no complaints of nasal congestion or discharge, no sore throat and no recent change in voice or hoarseness Cardiovascular:  no exercise intolerance, no chest pain, no  palpitations Respiratory:  no chronic cough, sputum, or hemoptysis and no shortness of breath Gastrointestinal:  no abdominal pain, no change in bowel habits, no significant change in appetite, no nausea, vomiting, diarrhea, or constipation and no black or bloody stool GU:  Female: negative for dysuria, frequency, and incontinence, Normal menses; no abnormal bleeding, pelvic pain, or discharge Musculoskeletal/Extremities:  no pain, redness, or swelling of the joints Integumentary (Skin/Breast):  no abnormal skin lesions reported, no new breast lumps or masses Neurologic:  no chronic headaches, no numbness, tingling, or tremor Psychiatric:  no anxiety, no depression Endocrine:  denies fatigue, weight changes, heat/cold intolerance, bowel or skin changes, or cardiovascular system symptoms Hematologic/Lymphatic:  no abnormal bleeding, no HIV risk factors, no night sweats, no swollen nodes, no weight loss Allergic/Immunologic:  no history of food or environmental allergies  Exam BP (!) 142/88 (BP Location: Left Arm, Patient Position: Sitting, Cuff Size: Normal)   Pulse 75   Temp 98.5 F (36.9 C) (Oral)   Ht 5\' 4"  (1.626 m)   Wt 179 lb (81.2 kg)   SpO2 99%   BMI 30.73 kg/m  General:  well developed, well nourished, in no apparent distress Skin:  no significant moles, warts, or growths Head:  no masses, lesions, or tenderness Eyes:  pupils equal and round, sclera anicteric without injection Ears:  canals without lesions, TMs shiny without retraction, no obvious effusion, no erythema Nose:  nares patent, septum midline, mucosa normal, and no drainage or sinus  tenderness Throat/Pharynx:  lips and gingiva without lesion; tongue and uvula midline; non-inflamed pharynx; no exudates or postnasal drainage Neck: neck supple without adenopathy, thyromegaly, or masses Breasts: not done Thorax:  nontender Lungs:  clear to auscultation, breath sounds equal bilaterally, no respiratory distress Cardio:   regular rate and rhythm, heart sounds without clicks or rubs, point of maximal impulse normal; no lifts, heaves, or thrills Abdomen:  abdomen soft, nontender; bowel sounds normal; no masses or organomegaly Genital: Not done Musculoskeletal:  symmetrical muscle groups noted without atrophy or deformity Extremities:  no clubbing, cyanosis, or edema, no deformities, no skin discoloration Neuro:  gait normal; deep tendon reflexes normal and symmetric Psych: well oriented with normal range of affect and appropriate judgment/insight  Assessment and Plan  Well adult exam  Osteopenia, unspecified location - Plan: DG Bone Density  Refuses tetanus, diphtheria, and acellular pertussis (Tdap) vaccination  Refused pneumococcal vaccination   Well 76 y.o. female. Counseled on diet and exercise. Other orders as above.  Follow up in 6 mo. The patient voiced understanding and agreement to the plan.  Jilda Roche [place], DO 04/06/17 3:28 PM

## 2017-08-08 ENCOUNTER — Encounter: Payer: Self-pay | Admitting: Family Medicine

## 2017-08-08 ENCOUNTER — Telehealth: Payer: Self-pay | Admitting: Family Medicine

## 2017-08-08 ENCOUNTER — Ambulatory Visit: Payer: Federal, State, Local not specified - PPO | Admitting: Family Medicine

## 2017-08-08 VITALS — BP 152/100 | HR 72 | Temp 98.5°F | Ht 64.0 in | Wt 175.0 lb

## 2017-08-08 DIAGNOSIS — E785 Hyperlipidemia, unspecified: Secondary | ICD-10-CM

## 2017-08-08 DIAGNOSIS — E039 Hypothyroidism, unspecified: Secondary | ICD-10-CM

## 2017-08-08 DIAGNOSIS — I10 Essential (primary) hypertension: Secondary | ICD-10-CM | POA: Diagnosis not present

## 2017-08-08 LAB — COMPREHENSIVE METABOLIC PANEL
ALK PHOS: 68 U/L (ref 39–117)
ALT: 19 U/L (ref 0–35)
AST: 22 U/L (ref 0–37)
Albumin: 3.6 g/dL (ref 3.5–5.2)
BILIRUBIN TOTAL: 0.4 mg/dL (ref 0.2–1.2)
BUN: 15 mg/dL (ref 6–23)
CO2: 31 meq/L (ref 19–32)
Calcium: 8.5 mg/dL (ref 8.4–10.5)
Chloride: 105 mEq/L (ref 96–112)
Creatinine, Ser: 1.11 mg/dL (ref 0.40–1.20)
GFR: 61.63 mL/min (ref >60.00)
GLUCOSE: 84 mg/dL (ref 70–99)
Potassium: 3.7 mEq/L (ref 3.5–5.1)
SODIUM: 142 meq/L (ref 135–145)
TOTAL PROTEIN: 6.4 g/dL (ref 6.0–8.3)

## 2017-08-08 LAB — LIPID PANEL
CHOL/HDL RATIO: 3
Cholesterol: 209 mg/dL — ABNORMAL HIGH (ref 0–200)
HDL: 74.1 mg/dL (ref >39.00)
LDL Cholesterol: 123 mg/dL — ABNORMAL HIGH (ref 0–99)
NONHDL: 135.25
Triglycerides: 63 mg/dL (ref 0.0–149.0)
VLDL: 12.6 mg/dL (ref 0.0–40.0)

## 2017-08-08 LAB — TSH: TSH: 5.96 u[IU]/mL — AB (ref 0.35–4.50)

## 2017-08-08 LAB — T4, FREE: FREE T4: 0.73 ng/dL (ref 0.60–1.60)

## 2017-08-08 MED ORDER — OLMESARTAN MEDOXOMIL 20 MG PO TABS: 20.0000 mg | ORAL_TABLET | Freq: Every day | ORAL | 1 refills | Status: DC

## 2017-08-08 MED ORDER — ATORVASTATIN CALCIUM 80 MG PO TABS: 80.0000 mg | ORAL_TABLET | Freq: Every day | ORAL | 1 refills | Status: DC

## 2017-08-08 NOTE — Telephone Encounter (Signed)
Spoke to the patient informed of PCP instructions. She agreed to medications--sent into her local [facility]. She will schedule followup appt.

## 2017-08-08 NOTE — Progress Notes (Signed)
Pre visit review using our clinic review tool, if applicable. No additional management support is needed unless otherwise documented below in the visit note. 

## 2017-08-08 NOTE — Telephone Encounter (Signed)
Let pt know that there are some alternative medicines I ran by the pharmacy team downstairs. Olmesartan is the blood pressure medicine and Lipitor is the cholesterol medicine. Neither is manufactured in [place]. She would have to contact her respective pharmacy to be sure, or we can sent it downstairs. I would like to see her in 6 weeks to make sure her BP is controlled on the new regimen. TY.

## 2017-08-08 NOTE — Patient Instructions (Addendum)
Let me get back to you regarding your blood pressure medicines.   Vick's VapoRub or Penlac over your nail daily for 9-15 months may be helpful.   Let us know if you need anything.

## 2017-08-08 NOTE — Progress Notes (Signed)
Chief Complaint  Patient presents with  . Follow-up    Subjective [place] P Felicia Yu is a 77 y.o. female who presents for hypertension follow up. She does not monitor home blood pressures. She stopped medications amlodipine and losartan due to the recent medication recall; she does not wish to be on any medicine if it was manufactured in [place]. She is adhering to a healthy diet overall. Current exercise: goes to [employer]  Hyperlipidemia Patient presents for dyslipidemia follow up. Currently being treated with Lipitor 80 mg/d and compliance with treatment thus far has been good until yesterday due to BP med recall (does not want anything from [place]). She denies myalgias. She is adhering to a healthy. The patient is not known to have coexisting coronary artery disease.  Great toenail, R has been thickened and discolored. Had used OTC topical preps for several days at a time without improvement. No pain, drainage or redness.    Past Medical History:  Diagnosis Date  . Essential hypertension, benign 08/18/2013  . High cholesterol   . Systolic ejection murmur 01/18/2017   Family History  Problem Relation Age of Onset  . Diabetes Unknown   . Hyperlipidemia Unknown   . Hypertension Unknown     Medications Current Outpatient Medications on File Prior to Visit  Medication Sig Dispense Refill  . amLODipine (NORVASC) 5 MG tablet Take 1 tablet (5 mg total) by mouth daily. 30 tablet 5  . atorvastatin (LIPITOR) 80 MG tablet Take 80 mg by mouth daily.    Marland Kitchen. losartan (COZAAR) 100 MG tablet Take 1 tablet (100 mg total) by mouth daily. 90 tablet 0  . OVER THE COUNTER MEDICATION Vitamin D3 800mg -Take 1 tablet by mouth twice a day.      Allergies Allergies  Allergen Reactions  . Lisinopril Other (See Comments)    Facial edema & blurred vision    Review of Systems Cardiovascular: no chest pain Respiratory:  no shortness of breath  Exam BP (!) 152/100 (BP Location: Left Arm,  Patient Position: Sitting, Cuff Size: Normal)   Pulse 72   Temp 98.5 F (36.9 C) (Oral)   Ht 5\' 4"  (1.626 m)   Wt 175 lb (79.4 kg)   SpO2 99%   BMI 30.04 kg/m  General:  well developed, well nourished, in no apparent distress Skin: warm, no pallor or diaphoresis Eyes: pupils equal and round, sclera anicteric without injection Heart: RRR, +SEM heard loudest at aortic listening post, no LE edema Lungs: clear to auscultation, no accessory muscle use Skin: Right great toenail is thickened and discolored.  There is no tenderness or signs of infection of the nail pulp. Psych: well oriented with normal range of affect and appropriate judgment/insight  Essential hypertension, benign - Plan: Comprehensive metabolic panel  Hypothyroidism, unspecified type - Plan: TSH, T4, free  Hyperlipidemia, unspecified hyperlipidemia type - Plan: Lipid panel  Check labs. We will see what medication she could be on that was not infection [place].  I will reach out to the pharmacy team and we will let her know. Counseled on diet and exercise F/u in 6 mo for med check, she will ck bp at home and come in sooner if high. The patient voiced understanding and agreement to the plan.  Jilda Rocheicholas Paul [place], DO 08/08/17  11:31 AM

## 2017-09-21 ENCOUNTER — Telehealth: Payer: Self-pay | Admitting: Family Medicine

## 2017-09-21 ENCOUNTER — Other Ambulatory Visit: Payer: Self-pay | Admitting: Family Medicine

## 2017-09-21 DIAGNOSIS — I10 Essential (primary) hypertension: Secondary | ICD-10-CM

## 2017-09-21 NOTE — Telephone Encounter (Signed)
Copied from CRM [id number]. Topic: Quick Communication - Rx Refill/Question >> Sep 21, 2017  3:24 PM Oneal GroutSebastian, Jennifer S wrote: Medication: amlodipine 5mg  and losartan 100mg    Has the patient contacted their pharmacy? Yes.     (Agent: If no, request that the patient contact the pharmacy for the refill.)   Preferred Pharmacy (with phone number or street name): [facility]  at [address]   Agent: Please be advised that RX refills may take up to 3 business days. We ask that you follow-up with your pharmacy.

## 2017-09-21 NOTE — Telephone Encounter (Signed)
Rx ordered at pharmacy 08/08/17. Call to pharmacy- told to fill for patient.

## 2017-09-24 ENCOUNTER — Telehealth: Payer: Self-pay | Admitting: Family Medicine

## 2017-09-24 NOTE — Telephone Encounter (Signed)
FYI

## 2017-09-24 NOTE — Telephone Encounter (Signed)
Patient was called to verify the prescription refill request for amlodipine and losartan. She says "I did not get the new medication filled that he prescribed for my BP because when I read the side effects, it was worse than the losartan. So I decided to take the amlodipine and losartan. Let Dr. Carmelia RollerWendling know I did not get the other BP medicine and I've been doing natural stuff, but my BP is going up. So I will go ahead and take the losartan." I advised I will send this to Dr. Carmelia RollerWendling.

## 2017-09-24 NOTE — Telephone Encounter (Unsigned)
Copied from CRM [id number]. Topic: Quick Communication - Rx Refill/Question >> Sep 21, 2017  3:24 PM Oneal GroutSebastian, Jennifer S wrote: Medication: amlodipine 5mg  and losartan 100mg    Has the patient contacted their pharmacy? Yes.     (Agent: If no, request that the patient contact the pharmacy for the refill.)   Preferred Pharmacy (with phone number or street name): [facility]  at [address]   Agent: Please be advised that RX refills may take up to 3 business days. We ask that you follow-up with your pharmacy. >> Sep 24, 2017  2:28 PM Raquel SarnaHayes, Teresa G wrote: amlodipine 5mg  and losartan 100mg  - 90 day supply  Pt was given wrong refills.  Pt is requesting the Rx's above be called in.    [phone] - home [phone] - cell

## 2018-02-06 ENCOUNTER — Ambulatory Visit: Payer: Federal, State, Local not specified - PPO | Admitting: Family Medicine

## 2019-03-05 ENCOUNTER — Telehealth: Payer: Self-pay | Admitting: Family Medicine

## 2019-03-05 NOTE — Telephone Encounter (Signed)
Patient is requesting a transfer of care appointment from Dr. Nani Ravens to Dr. Larose Kells. Patient advised that Dr. Larose Kells is not currently accepting new patients. Please advise.

## 2019-03-05 NOTE — Telephone Encounter (Signed)
OK to transfer to whomever she wishes. She should check out the team at the [place] office if Dr Larose Kells is closed to new patients.

## 2019-03-05 NOTE — Telephone Encounter (Signed)
Called left message to call back 

## 2019-03-06 ENCOUNTER — Other Ambulatory Visit: Payer: Self-pay | Admitting: Family Medicine

## 2019-03-06 DIAGNOSIS — I10 Essential (primary) hypertension: Secondary | ICD-10-CM

## 2019-03-06 NOTE — Telephone Encounter (Signed)
Unable to take new patients. 

## 2019-03-06 NOTE — Telephone Encounter (Signed)
Pt called back for Felicia Yu. She states she is waiting to find out about a transfer to Dr. Larose Kells. Please advise.

## 2019-03-10 ENCOUNTER — Encounter: Payer: Self-pay | Admitting: Family Medicine

## 2019-03-10 ENCOUNTER — Other Ambulatory Visit: Payer: Self-pay

## 2019-03-10 ENCOUNTER — Ambulatory Visit: Payer: Federal, State, Local not specified - PPO | Admitting: Family Medicine

## 2019-03-10 VITALS — BP 140/90 | HR 71 | Temp 98.7°F | Ht 64.0 in | Wt 178.0 lb

## 2019-03-10 DIAGNOSIS — Z23 Encounter for immunization: Secondary | ICD-10-CM

## 2019-03-10 DIAGNOSIS — E785 Hyperlipidemia, unspecified: Secondary | ICD-10-CM | POA: Diagnosis not present

## 2019-03-10 DIAGNOSIS — I10 Essential (primary) hypertension: Secondary | ICD-10-CM | POA: Diagnosis not present

## 2019-03-10 DIAGNOSIS — K219 Gastro-esophageal reflux disease without esophagitis: Secondary | ICD-10-CM | POA: Diagnosis not present

## 2019-03-10 DIAGNOSIS — E2839 Other primary ovarian failure: Secondary | ICD-10-CM | POA: Insufficient documentation

## 2019-03-10 DIAGNOSIS — K635 Polyp of colon: Secondary | ICD-10-CM

## 2019-03-10 LAB — COMPREHENSIVE METABOLIC PANEL
ALT: 11 U/L (ref 0–35)
AST: 17 U/L (ref 0–37)
Albumin: 4 g/dL (ref 3.5–5.2)
Alkaline Phosphatase: 74 U/L (ref 39–117)
BUN: 13 mg/dL (ref 6–23)
CO2: 28 mEq/L (ref 19–32)
Calcium: 9.4 mg/dL (ref 8.4–10.5)
Chloride: 104 mEq/L (ref 96–112)
Creatinine, Ser: 1.18 mg/dL (ref 0.40–1.20)
GFR: 53.81 mL/min — ABNORMAL LOW (ref >60.00)
Glucose, Bld: 95 mg/dL (ref 70–99)
Potassium: 3.9 mEq/L (ref 3.5–5.1)
Sodium: 140 mEq/L (ref 135–145)
Total Bilirubin: 0.4 mg/dL (ref 0.2–1.2)
Total Protein: 6.7 g/dL (ref 6.0–8.3)

## 2019-03-10 LAB — LIPID PANEL
Cholesterol: 233 mg/dL — ABNORMAL HIGH (ref 0–200)
HDL: 101.3 mg/dL (ref >39.00)
LDL Cholesterol: 121 mg/dL — ABNORMAL HIGH (ref 0–99)
NonHDL: 131.72
Total CHOL/HDL Ratio: 2
Triglycerides: 54 mg/dL (ref 0.0–149.0)
VLDL: 10.8 mg/dL (ref 0.0–40.0)

## 2019-03-10 MED ORDER — AMLODIPINE BESYLATE 5 MG PO TABS: 5.0000 mg | ORAL_TABLET | Freq: Every day | ORAL | 2 refills | Status: DC

## 2019-03-10 MED ORDER — ROSUVASTATIN CALCIUM 10 MG PO TABS: 10.0000 mg | ORAL_TABLET | Freq: Every day | ORAL | 2 refills | Status: DC

## 2019-03-10 NOTE — Progress Notes (Signed)
Chief Complaint  Patient presents with  . Follow-up    medication followup    Subjective: Hyperlipidemia Patient presents for Hyperlipidemia follow up. Currently taking Crestor 10 mg/d and compliance with treatment has been poor. She is adhering to a healthy diet. Exercise: walking The patient is not known to have coexisting coronary artery disease.  Hypertension Patient presents for hypertension follow up. She does monitor home blood pressures. Blood pressures ranging on average from 140/'s/70-80's. She is compliant with medication- Norvasc 5 mg/d. Patient has these side effects of medication: none She is adhering to a healthy diet overall. Exercise: walking  Hx of colon polyps, needs to have a colonoscopy.  She was previously seen at to see a [facility] affiliated she was also told by 1 of her physicians in [place] that she has polyps on her vocal cords which are suggestive of reflux.  She was told she needs an endoscopy.   ROS: Heart: Denies chest pain Lungs: Denies SOB   Past Medical History:  Diagnosis Date  . Essential hypertension, benign 08/18/2013  . High cholesterol   . Systolic ejection murmur 7/61/9509    Objective: BP 140/90 (BP Location: Left Arm, Patient Position: Sitting, Cuff Size: Normal)   Pulse 71   Temp 98.7 F (37.1 C) (Oral)   Ht 5\' 4"  (1.626 m)   Wt 178 lb (80.7 kg)   SpO2 97%   BMI 30.55 kg/m  General: Awake, appears stated age HEENT: MMM Heart: RRR, no LE edema, no bruits Lungs: CTAB, no rales, wheezes or rhonchi. No accessory muscle use Psych: Age appropriate judgment and insight, normal affect and mood  Assessment and Plan: Essential hypertension, benign - Plan: amLODipine (NORVASC) 5 MG tablet, goal blood pressure is <150/90  Polyp of colon, unspecified part of colon, unspecified type - Plan: Ambulatory referral to Gastroenterology  Gastroesophageal reflux disease, esophagitis presence not specified - Plan: Ambulatory referral to  Gastroenterology  Estrogen deficiency - Plan: DG Bone Density  Hyperlipidemia, unspecified hyperlipidemia type - Plan: Comprehensive metabolic panel, Lipid panel  Need for vaccination against Streptococcus pneumoniae - Plan: Pneumococcal polysaccharide vaccine 23-valent greater than or equal to 2yo subcutaneous/IM  Declines the tetanus shot today.   Orders as above. F/u in 6 months or as needed. The patient voiced understanding and agreement to the plan.  [place], DO 03/10/19  11:45 AM

## 2019-03-10 NOTE — Patient Instructions (Addendum)
If you do not hear anything about your referral in the next 1-2 weeks, call our office and ask for an update.  Keep the diet clean and stay active.  Give us 2-3 business days to get the results of your labs back.   Let us know if you need anything.  

## 2019-03-11 ENCOUNTER — Telehealth: Payer: Self-pay | Admitting: Family Medicine

## 2019-03-11 NOTE — Telephone Encounter (Signed)
LM to schedule 6 month f/u

## 2019-05-01 ENCOUNTER — Encounter: Payer: Self-pay | Admitting: Internal Medicine

## 2019-06-13 ENCOUNTER — Telehealth: Payer: Self-pay | Admitting: Family Medicine

## 2019-06-13 NOTE — Telephone Encounter (Signed)
Patient came into the office to inform Dr. Nani Ravens that her BP is 200/100. Advised patient to go to ED. Patient refused. Patient requested increased dose of BP medication. Advised that an appointment was necessary and PCP was booked for the day. Suggested appointment with Debbrah Alar and patient refused because she was a Designer, jewellery. Patient left office without scheduling or going to ED to my knowledge.

## 2019-09-12 ENCOUNTER — Ambulatory Visit: Payer: Federal, State, Local not specified - PPO | Admitting: Family Medicine

## 2019-09-29 ENCOUNTER — Encounter: Payer: Federal, State, Local not specified - PPO | Admitting: Obstetrics & Gynecology

## 2019-10-13 ENCOUNTER — Other Ambulatory Visit: Payer: Self-pay

## 2019-10-14 ENCOUNTER — Ambulatory Visit (INDEPENDENT_AMBULATORY_CARE_PROVIDER_SITE_OTHER): Payer: Federal, State, Local not specified - PPO | Admitting: Family Medicine

## 2019-10-14 ENCOUNTER — Encounter: Payer: Self-pay | Admitting: Family Medicine

## 2019-10-14 ENCOUNTER — Other Ambulatory Visit: Payer: Self-pay

## 2019-10-14 VITALS — BP 140/80 | HR 70 | Temp 96.8°F | Ht 64.0 in | Wt 185.1 lb

## 2019-10-14 DIAGNOSIS — Z1211 Encounter for screening for malignant neoplasm of colon: Secondary | ICD-10-CM | POA: Diagnosis not present

## 2019-10-14 DIAGNOSIS — Z0000 Encounter for general adult medical examination without abnormal findings: Secondary | ICD-10-CM

## 2019-10-14 DIAGNOSIS — I10 Essential (primary) hypertension: Secondary | ICD-10-CM

## 2019-10-14 LAB — COMPREHENSIVE METABOLIC PANEL
ALT: 12 U/L (ref 0–35)
AST: 20 U/L (ref 0–37)
Albumin: 3.9 g/dL (ref 3.5–5.2)
Alkaline Phosphatase: 73 U/L (ref 39–117)
BUN: 14 mg/dL (ref 6–23)
CO2: 31 mEq/L (ref 19–32)
Calcium: 9.4 mg/dL (ref 8.4–10.5)
Chloride: 105 mEq/L (ref 96–112)
Creatinine, Ser: 1.07 mg/dL (ref 0.40–1.20)
GFR: 60.15 mL/min (ref >60.00)
Glucose, Bld: 96 mg/dL (ref 70–99)
Potassium: 4.2 mEq/L (ref 3.5–5.1)
Sodium: 139 mEq/L (ref 135–145)
Total Bilirubin: 0.6 mg/dL (ref 0.2–1.2)
Total Protein: 6.8 g/dL (ref 6.0–8.3)

## 2019-10-14 LAB — LIPID PANEL
Cholesterol: 240 mg/dL — ABNORMAL HIGH (ref 0–200)
HDL: 100.3 mg/dL (ref >39.00)
LDL Cholesterol: 126 mg/dL — ABNORMAL HIGH (ref 0–99)
NonHDL: 139.37
Total CHOL/HDL Ratio: 2
Triglycerides: 67 mg/dL (ref 0.0–149.0)
VLDL: 13.4 mg/dL (ref 0.0–40.0)

## 2019-10-14 MED ORDER — AMLODIPINE BESYLATE 10 MG PO TABS: 10.0000 mg | ORAL_TABLET | Freq: Every day | ORAL | 1 refills | Status: DC

## 2019-10-14 MED ORDER — ROSUVASTATIN CALCIUM 10 MG PO TABS: 10.0000 mg | ORAL_TABLET | Freq: Every day | ORAL | 2 refills | Status: DC

## 2019-10-14 NOTE — Patient Instructions (Addendum)
Give [place] 2-3 business days to get the results of your labs back.   Keep the diet clean and stay active.  The new Shingrix vaccine (for shingles) is a 2 shot series. It can make people feel low energy, achy and almost like they have the flu for 48 hours after injection. Please plan accordingly when deciding on when to get this shot. Call our office for a nurse visit appointment to get this. The second shot of the series is less severe regarding the side effects, but it still lasts 48 hours.   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   Let us know if you need anything.

## 2019-10-14 NOTE — Progress Notes (Signed)
Chief Complaint  Patient presents with  . Annual Exam     Well Woman Felicia Yu is here for a complete physical.   Her last physical was >1 year ago.  Current diet: in general, diet could be better. Current exercise: some walking. Weight is increasing and she denies daytime fatigue. Seatbelt? Yes  Health Maintenance Shingrix- No DEXA- ordered; had not gotten it Tetanus- Declined Pneumonia- Yes  Past Medical History:  Diagnosis Date  . Essential hypertension, benign 08/18/2013  . High cholesterol   . Systolic ejection murmur 01/18/2017     Past Surgical History:  Procedure Laterality Date  . NO PAST SURGERIES      Medications  Current Outpatient Medications on File Prior to Visit  Medication Sig Dispense Refill  . amLODipine (NORVASC) 5 MG tablet Take 1 tablet (5 mg total) by mouth daily. Needs ov before any more refills 90 tablet 2  . OVER THE COUNTER MEDICATION Vitamin D3 800mg -Take 1 tablet by mouth twice a day.    . rosuvastatin (CRESTOR) 10 MG tablet Take 1 tablet (10 mg total) by mouth daily. 90 tablet 2    Allergies Allergies  Allergen Reactions  . Lisinopril Other (See Comments)    Facial edema & blurred vision    Review of Systems: Constitutional:  no sweats Eye:  no recent significant change in vision Ear/Nose/Mouth/Throat:  Ears:  No changes in hearing Nose/Mouth/Throat:  no complaints of nasal congestion, no sore throat Cardiovascular: no chest pain Respiratory:  No cough and no shortness of breath Gastrointestinal:  no abdominal pain, no change in bowel habits GU:  Female: negative for dysuria or pelvic pain Musculoskeletal/Extremities: +hand pain; otherwise no pain of the joints Integumentary (Skin/Breast):  no abnormal skin lesions reported Neurologic:  no headaches Psychiatric:  no anxiety, no depression Endocrine:  denies unexplained weight changes Hematologic/Lymphatic:  no abnormal bleeding  Exam BP 140/80 (BP Location: Left Arm,  Patient Position: Sitting, Cuff Size: Normal)   Pulse 70   Temp (!) 96.8 F (36 C) (Temporal)   Ht 5\' 4"  (1.626 m)   Wt 185 lb 2 oz (84 kg)   SpO2 96%   BMI 31.78 kg/m  General:  well developed, well nourished, in no apparent distress Skin:  no significant moles, warts, or growths Head:  no masses, lesions, or tenderness Eyes:  pupils equal and round, sclera anicteric without injection Ears:  canals without lesions, TMs shiny without retraction, no obvious effusion, no erythema Nose:  nares patent, septum midline, mucosa normal, and no drainage or sinus tenderness Throat/Pharynx:  lips and gingiva without lesion; tongue and uvula midline; non-inflamed pharynx; no exudates or postnasal drainage Neck: neck supple without adenopathy, thyromegaly, or masses Lungs:  clear to auscultation, breath sounds equal bilaterally, no respiratory distress Cardio:  regular rate and rhythm, no bruits or LE edema Abdomen:  abdomen soft, nontender; bowel sounds normal; no masses or organomegaly Genital: Deferred Musculoskeletal:  symmetrical muscle groups noted without atrophy or deformity; Heberden's nodes noted on all fingers Extremities:  no clubbing, cyanosis, or edema, no deformities, no skin discoloration Neuro:  gait normal; deep tendon reflexes normal and symmetric Psych: well oriented with normal range of affect and appropriate judgment/insight  Assessment and Plan  Well adult exam - Plan: Comprehensive metabolic panel, Lipid panel  Essential hypertension, benign  Screen for colon cancer - Plan: Ambulatory referral to Gastroenterology   Well 79 y.o. female. Counseled on diet and exercise. Other orders as above. Pt was told she  needed to f/u for ccs with prior provider, but she never did. Requesting referral to GI team here.  Follow up in 6 mo pending the above workup. The patient voiced understanding and agreement to the plan.  [place], DO 10/14/19 8:40 AM

## 2019-10-17 ENCOUNTER — Telehealth: Payer: Self-pay | Admitting: Gastroenterology

## 2019-10-17 NOTE — Telephone Encounter (Signed)
Referral received from PCP.  Pt saw [facility] in 2020 and requesting to switch to [facility].  She stated she is having rectal bleeding.  Going to print records from [employer] and place on your desk for review. Please advice scheduling

## 2019-10-22 NOTE — Telephone Encounter (Signed)
Records reviewed the following:  79 year old female seen by Dr. Octaviano Glow at [facility] on 07/02/2019 for evaluation of GERD and family history of colon cancer.  Sister with a history of colon cancer.  Patient's last colonoscopy was 10 years prior.  Additionally, history of GERD and indigestion and was previously dilated by ENT and told to start PPI which she did with partial relief.  No dysphagia, odynophagia, weight loss, melena, hematochezia.  PMH notable for hypertension, hypothyroidism, hyperlipidemia, GERD, vitamin D deficiency, prediabetes, obesity (BMI 30, CKD 3, TIA, systolic ejection murmur.  No previous surgeries.  Non-smoker, nondrinker.  Plan at that appointment was for EGD for evaluation of GERD and dyspepsia along with colonoscopy given family history of colon cancer and interval since last colonoscopy.  Does not appear that that EGD/colonoscopy was ever completed at [facility].  Now with follow-up for rectal bleeding.  Okay to schedule office visit with me for evaluation.  Thanks.

## 2019-10-23 ENCOUNTER — Encounter: Payer: Self-pay | Admitting: Gastroenterology

## 2019-11-18 ENCOUNTER — Other Ambulatory Visit: Payer: Self-pay

## 2019-11-18 ENCOUNTER — Ambulatory Visit: Payer: Federal, State, Local not specified - PPO | Admitting: Gastroenterology

## 2019-11-18 ENCOUNTER — Encounter: Payer: Self-pay | Admitting: Gastroenterology

## 2019-11-18 VITALS — BP 126/74 | HR 74 | Temp 97.9°F | Ht 64.0 in | Wt 181.0 lb

## 2019-11-18 DIAGNOSIS — Z1211 Encounter for screening for malignant neoplasm of colon: Secondary | ICD-10-CM

## 2019-11-18 DIAGNOSIS — K921 Melena: Secondary | ICD-10-CM

## 2019-11-18 DIAGNOSIS — K219 Gastro-esophageal reflux disease without esophagitis: Secondary | ICD-10-CM

## 2019-11-18 DIAGNOSIS — Z800 Family history of malignant neoplasm of digestive organs: Secondary | ICD-10-CM

## 2019-11-18 MED ORDER — CLENPIQ 10-3.5-12 MG-GM -GM/160ML PO SOLN: 1.0000 | Freq: Once | ORAL | 0 refills | Status: AC

## 2019-11-18 NOTE — Progress Notes (Signed)
Chief Complaint: Hematochezia, family history of colon cancer, GERD  Referring Provider:     Sharlene Dory DO   HPI:     Felicia Yu is a 79 y.o. female w/ a hx of hypertension,  hyperlipidemia, GERD, vitamin D deficiency, prediabetes, obesity (BMI 31),  referred to the Gastroenterology Clinic for evaluation of hematochezia and GERD along with evaluation for colonoscopy due to strong family history of colon cancer.  Reports seeing BRBPR x1 recently.  Otherwise, bowel habits unchanged.  No abdominal pain, nausea, vomiting, fevers, chills, weight loss, night sweats.  Family history notable for sister with colon cancer age 45, currently undergoing chemotherapy. Brother with CRC as well but details unknown.  Has had several colonoscopies in the past, but does not recall any polyps.  Last colonoscopy was approximately 2010.  History of reflux sxs, characterized by HB.  Rare regurgitation.  Worse with spicy foods. Does not take any acid suppression meds, antacids, etc. No dysphagia, odynophagia. EGD approx 10 years ago.   Previously seen by Dr. Octaviano Glow at [facility] on 07/02/2019 for evaluation of GERD and family history of colon cancer.   Additionally, history of GERD and indigestion and was previously diagnosed by ENT and told to start PPI which she did with partial relief at that time (has since discontinued).   Plan at that appointment was for EGD for evaluation of GERD and dyspepsia along with colonoscopy given family history of colon cancer in interval since last colonoscopy, but was not completed.  Normal CMP 10/2019.    Past Medical History:  Diagnosis Date  . Essential hypertension, benign 08/18/2013  . High cholesterol   . Systolic ejection murmur 01/18/2017     Past Surgical History:  Procedure Laterality Date  . NO PAST SURGERIES     Family History  Problem Relation Age of Onset  . Diabetes Other   . Hyperlipidemia Other   .  Hypertension Other   . Colon cancer Sister   . Colon cancer Brother    Social History   Tobacco Use  . Smoking status: Never Smoker  . Smokeless tobacco: Never Used  Substance Use Topics  . Alcohol use: No  . Drug use: No   Current Outpatient Medications  Medication Sig Dispense Refill  . amLODipine (NORVASC) 10 MG tablet Take 1 tablet (10 mg total) by mouth daily. 90 tablet 1  . OVER THE COUNTER MEDICATION Vitamin D3 800mg -Take 1 tablet by mouth twice a day.    . rosuvastatin (CRESTOR) 10 MG tablet Take 1 tablet (10 mg total) by mouth daily. 90 tablet 2   No current facility-administered medications for this visit.   Allergies  Allergen Reactions  . Lisinopril Other (See Comments)    Facial edema & blurred vision     Review of Systems: All systems reviewed and negative except where noted in HPI.     Physical Exam:    Wt Readings from Last 3 Encounters:  11/18/19 181 lb (82.1 kg)  10/14/19 185 lb 2 oz (84 kg)  03/10/19 178 lb (80.7 kg)    BP 126/74   Pulse 74   Temp 97.9 F (36.6 C)   Ht 5\' 4"  (1.626 m)   Wt 181 lb (82.1 kg)   BMI 31.07 kg/m  Constitutional:  Pleasant, in no acute distress. Psychiatric: Normal mood and affect. Behavior is normal. EENT: Pupils normal.  Conjunctivae are normal. No scleral icterus.  Neck supple. No cervical LAD. Cardiovascular: Normal rate, regular rhythm. No edema Pulmonary/chest: Effort normal and breath sounds normal. No wheezing, rales or rhonchi. Abdominal: Soft, nondistended, nontender. Bowel sounds active throughout. There are no masses palpable. No hepatomegaly. Neurological: Alert and oriented to person place and time. Skin: Skin is warm and dry. No rashes noted. Rectal: Exam deferred by patient to time of colonoscopy.    ASSESSMENT AND PLAN;   1) Colon cancer screening 2) Family history of colon cancer -Due for ongoing colon cancer screening, particularly given sister with CRC diagnosed within the past year at  age 30, and brother with CRC  -Schedule colonoscopy  3) Hematochezia -Single episode of painless BRBPR.  No recurrence.  Can certainly assess for etiology at time of colonoscopy.  4) GERD: -Describes intermittent episodes of heartburn with rare episodes of regurgitation.  Does not take any medications for this.  Largely controlled with dietary modifications. However, with apparent extra esophageal manifestations of reflux as reported previously by ENT.  She is concerned about her reflux and sequelae of reflux, and would like to proceed with EGD instead of trial of medical management -EGD to assess for hiatal hernia, LES laxity, erosive esophagitis, Barrett's esophagus screening -Holding off on trial of new medications pending endoscopic findings  The indications, risks, and benefits of EGD and colonoscopy were explained to the patient in detail. Risks include but are not limited to bleeding, perforation, adverse reaction to medications, and cardiopulmonary compromise. Sequelae include but are not limited to the possibility of surgery, hositalization, and mortality. The patient verbalized understanding and wished to proceed. All questions answered, referred to scheduler and bowel prep ordered. Further recommendations pending results of the exam.     Lavena Bullion, DO, FACG  11/18/2019, 2:54 PM   [name], [place]*

## 2019-11-18 NOTE — Patient Instructions (Signed)
You have been scheduled for an endoscopy and colonoscopy. Please follow the written instructions given to you at your visit today. Please pick up your prep supplies at the pharmacy within the next 1-3 days. If you use inhalers (even only as needed), please bring them with you on the day of your procedure. Your physician has requested that you go to www.startemmi.com and enter the access code given to you at your visit today. This web site gives a general overview about your procedure. However, you should still follow specific instructions given to you by our office regarding your preparation for the procedure.  It was a pleasure to see you today!  Vito Cirigliano, D.O.  

## 2019-12-15 ENCOUNTER — Encounter: Payer: Self-pay | Admitting: Gastroenterology

## 2021-03-09 ENCOUNTER — Ambulatory Visit: Payer: Federal, State, Local not specified - PPO | Admitting: Family Medicine

## 2021-03-09 ENCOUNTER — Other Ambulatory Visit: Payer: Self-pay

## 2021-03-09 ENCOUNTER — Encounter: Payer: Self-pay | Admitting: Family Medicine

## 2021-03-09 VITALS — BP 138/78 | HR 53 | Temp 98.5°F | Ht 64.0 in | Wt 175.0 lb

## 2021-03-09 DIAGNOSIS — Z23 Encounter for immunization: Secondary | ICD-10-CM

## 2021-03-09 DIAGNOSIS — E785 Hyperlipidemia, unspecified: Secondary | ICD-10-CM | POA: Diagnosis not present

## 2021-03-09 DIAGNOSIS — Z0000 Encounter for general adult medical examination without abnormal findings: Secondary | ICD-10-CM

## 2021-03-09 DIAGNOSIS — Z5320 Procedure and treatment not carried out because of patient's decision for unspecified reasons: Secondary | ICD-10-CM

## 2021-03-09 NOTE — Addendum Note (Signed)
Addended by: Scharlene Gloss B on: 03/09/2021 03:16 PM   Modules accepted: Orders

## 2021-03-09 NOTE — Progress Notes (Signed)
Chief Complaint  Patient presents with   labs today   Follow-up     Well Woman Felicia Yu is here for a complete physical.   Her last physical was >1 year ago.  Current diet: in general, a "pretty healthy" diet. Current exercise: walking. Weight is stable and she denies daytime fatigue. Seatbelt? Yes  Health Maintenance Shingrix- No DEXA- Yes Tetanus- No Pneumonia- Yes Hep C screen- No  Past Medical History:  Diagnosis Date   Essential hypertension, benign 08/18/2013   Family hx of colon cancer    High cholesterol    Systolic ejection murmur 01/18/2017     Past Surgical History:  Procedure Laterality Date   NO PAST SURGERIES      Medications  Current Outpatient Medications on File Prior to Visit  Medication Sig Dispense Refill   amLODipine (NORVASC) 10 MG tablet Take 1 tablet (10 mg total) by mouth daily. 90 tablet 1   OVER THE COUNTER MEDICATION Vitamin D3 800mg -Take 1 tablet by mouth twice a day.     rosuvastatin (CRESTOR) 10 MG tablet Take 1 tablet (10 mg total) by mouth daily. 90 tablet 2   Allergies Allergies  Allergen Reactions   Lisinopril Other (See Comments)    Facial edema & blurred vision    Review of Systems: Constitutional:  no fevers Eye:  no recent significant change in vision Ears:  No changes in hearing Nose/Mouth/Throat:  no complaints of nasal congestion, no sore throat Cardiovascular: no chest pain Respiratory:  No shortness of breath Gastrointestinal:  No change in bowel habits GU:  Female: negative for dysuria Integumentary:  no abnormal skin lesions reported Neurologic:  no headaches Endocrine:  denies unexplained weight changes  Exam BP 138/78   Pulse (!) 53   Temp 98.5 F (36.9 C) (Oral)   Ht 5\' 4"  (1.626 m)   Wt 175 lb (79.4 kg)   SpO2 98%   BMI 30.04 kg/m  General:  well developed, well nourished, in no apparent distress Skin:  no significant moles, warts, or growths Head:  no masses, lesions, or  tenderness Eyes:  pupils equal and round, sclera anicteric without injection Ears:  canals without lesions, TMs shiny without retraction, no obvious effusion, no erythema Nose:  nares patent, septum midline, mucosa normal, and no drainage or sinus tenderness Throat/Pharynx:  lips and gingiva without lesion; tongue and uvula midline; non-inflamed pharynx; no exudates or postnasal drainage Neck: neck supple without adenopathy, thyromegaly, or masses Lungs:  clear to auscultation, breath sounds equal bilaterally, no respiratory distress Cardio:  bradycardic, regular rhythm, no bruits or LE edema Abdomen:  abdomen soft, nontender; bowel sounds normal; no masses or organomegaly Genital: Deferred Neuro:  gait normal; deep tendon reflexes normal and symmetric Psych: well oriented with normal range of affect and appropriate judgment/insight  Assessment and Plan  Well adult exam  Hyperlipidemia, unspecified hyperlipidemia type - Plan: Comprehensive metabolic panel, Lipid panel  Screening for hepatitis C declined   Well 80 y.o. female. Counseled on diet and exercise. Nml Dexa in [place] 6 mo ago. Declines Hep C screening. Declines Shingrix and Tdap.  PCV20 today.  Other orders as above. Follow up in 6 mo or prn. The patient voiced understanding and agreement to the plan.  [place], DO 03/09/21 2:54 PM

## 2021-03-09 NOTE — Patient Instructions (Signed)
Give us 2-3 business days to get the results of your labs back.   Keep the diet clean and stay active.  Let us know if you need anything. 

## 2021-03-10 LAB — COMPREHENSIVE METABOLIC PANEL
ALT: 10 U/L (ref 0–35)
AST: 18 U/L (ref 0–37)
Albumin: 3.9 g/dL (ref 3.5–5.2)
Alkaline Phosphatase: 64 U/L (ref 39–117)
BUN: 15 mg/dL (ref 6–23)
CO2: 27 mEq/L (ref 19–32)
Calcium: 8.9 mg/dL (ref 8.4–10.5)
Chloride: 105 mEq/L (ref 96–112)
Creatinine, Ser: 1.12 mg/dL (ref 0.40–1.20)
GFR: 47.09 mL/min — ABNORMAL LOW (ref >60.00)
Glucose, Bld: 77 mg/dL (ref 70–99)
Potassium: 4 mEq/L (ref 3.5–5.1)
Sodium: 141 mEq/L (ref 135–145)
Total Bilirubin: 0.4 mg/dL (ref 0.2–1.2)
Total Protein: 6.7 g/dL (ref 6.0–8.3)

## 2021-03-10 LAB — LIPID PANEL
Cholesterol: 237 mg/dL — ABNORMAL HIGH (ref 0–200)
HDL: 100.5 mg/dL (ref >39.00)
LDL Cholesterol: 125 mg/dL — ABNORMAL HIGH (ref 0–99)
NonHDL: 136.64
Total CHOL/HDL Ratio: 2
Triglycerides: 57 mg/dL (ref 0.0–149.0)
VLDL: 11.4 mg/dL (ref 0.0–40.0)

## 2021-03-25 ENCOUNTER — Telehealth (INDEPENDENT_AMBULATORY_CARE_PROVIDER_SITE_OTHER): Payer: Federal, State, Local not specified - PPO | Admitting: Family Medicine

## 2021-03-25 ENCOUNTER — Telehealth: Payer: Self-pay | Admitting: Family Medicine

## 2021-03-25 ENCOUNTER — Encounter: Payer: Self-pay | Admitting: Family Medicine

## 2021-03-25 DIAGNOSIS — U071 COVID-19: Secondary | ICD-10-CM

## 2021-03-25 MED ORDER — BENZONATATE 100 MG PO CAPS: 100.0000 mg | ORAL_CAPSULE | Freq: Three times a day (TID) | ORAL | 0 refills | Status: DC | PRN

## 2021-03-25 NOTE — Telephone Encounter (Signed)
Called and spoke to the patient, scheduled today at 4:15.

## 2021-03-25 NOTE — Telephone Encounter (Signed)
Called the patient again left message to call back.

## 2021-03-25 NOTE — Telephone Encounter (Signed)
Called left message to call back 

## 2021-03-25 NOTE — Progress Notes (Addendum)
Chief Complaint  Patient presents with   Sore Throat    Tested positive Covid    Felicia Yu here for URI complaints. Due to COVID-19 pandemic, we are interacting via telephone. I verified patient's ID using 2 identifiers. Patient agreed to proceed with visit via this method. Patient is at home, I am at office. Patient and I are present for visit.    Duration: 2 weeks  Associated symptoms: sinus congestion, rhinorrhea, sore throat, and coughing, fatigue Denies: sinus pain, itchy watery eyes, ear pain, wheezing, shortness of breath, myalgia, and fevers Treatment to date: drinking lemon water, tea, garlic, turmeric  Getting better but lingering coughing.  Sick contacts: no Tested + for covid 2 weeks ago.  Quadruple vaccinated.   Past Medical History:  Diagnosis Date   Essential hypertension, benign 08/18/2013   Family hx of colon cancer    High cholesterol    Systolic ejection murmur 01/18/2017    Objective No conversational dyspnea Age appropriate judgment and insight Nml affect and mood  COVID-19 - Plan: benzonatate (TESSALON) 100 MG capsule  She is also of the window where an antiviral would be helpful.  She is improving, I will recommend the above medication as needed to help control the cough.  Supportive care otherwise. Continue to push fluids, practice good hand hygiene, cover mouth when coughing. F/u prn. If starting to experience ear replaceable fluid loss, shaking, or shortness of breath, seek immediate care. Total time: 7 minutes Pt voiced understanding and agreement to the plan.  Jilda Roche [place], DO 03/25/21 4:34 PM

## 2021-03-25 NOTE — Telephone Encounter (Signed)
Pt was covid + a week ago pt is still coughing/congestion and would like to know what she can take to help with it. No openings today for a virtual and pt does not want to go through the weekend feeling bad. Please call pt.

## 2021-03-25 NOTE — Telephone Encounter (Signed)
Called twice left message to call back. 

## 2021-03-25 NOTE — Telephone Encounter (Signed)
Ok to sched for [phone]. Ty.

## 2021-04-18 ENCOUNTER — Encounter: Payer: Self-pay | Admitting: *Deleted

## 2021-04-19 ENCOUNTER — Ambulatory Visit: Payer: Federal, State, Local not specified - PPO | Admitting: Family Medicine

## 2021-07-06 ENCOUNTER — Encounter: Payer: Self-pay | Admitting: Family Medicine

## 2021-07-06 ENCOUNTER — Other Ambulatory Visit: Payer: Self-pay

## 2021-07-06 ENCOUNTER — Ambulatory Visit: Payer: Federal, State, Local not specified - PPO | Admitting: Family Medicine

## 2021-07-06 VITALS — BP 120/78 | HR 85 | Temp 98.1°F | Ht 64.0 in | Wt 177.0 lb

## 2021-07-06 DIAGNOSIS — M79605 Pain in left leg: Secondary | ICD-10-CM

## 2021-07-06 MED ORDER — AMLODIPINE BESYLATE 10 MG PO TABS: 10.0000 mg | ORAL_TABLET | Freq: Every day | ORAL | 2 refills | Status: DC
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
## 2021-07-06 NOTE — Patient Instructions (Addendum)
Heat (pad or rice pillow in microwave) over affected area, 10-15 minutes twice daily.   Ice/cold pack over area for 10-15 min twice daily.  OK to take Tylenol 1000 mg (2 extra strength tabs) or 975 mg (3 regular strength tabs) every 6 hours as needed.  Consider topical Voltaren gel.   Foods that may reduce pain: 1) Ginger 2) Blueberries 3) Salmon 4) Pumpkin seeds 5) dark chocolate 6) turmeric 7) tart cherries 8) virgin olive oil 9) chilli peppers 10) mint  Let us know if you need anything.  Stretching and range of motion exercises These exercises warm up your muscles and joints and improve the movement and flexibility of your lower leg. These exercises also help to relieve pain and stiffness.  Exercise A: Gastrocnemius stretch Sit with your left / right leg extended. Loop a belt or towel around the ball of your left / right foot. The ball of your foot is on the walking surface, right under your toes. Hold both ends of the belt or towel. Keep your left / right ankle and foot relaxed and keep your knee straight while you use the belt or towel to pull your foot and ankle toward you. Stop at the first point of resistance. Hold this position for 30 seconds. Repeat 2 times. Complete this exercise 3 times per week.  Exercise B: Ankle alphabet Sit with your left / right leg supported at the lower leg. Do not rest your foot on anything. Make sure your foot has room to move freely. Think of your left / right foot as a paintbrush, and move your foot to trace each letter of the alphabet in the air. Keep your hip and knee still while you trace. Trace every letter from A to Z. Repeat 2 times. Complete this exercise 3 times per week.  Strengthening exercises These exercises build strength and endurance in your lower leg. Endurance is the ability to use your muscles for a long time, even after they get tired.  Exercise C: Plantar flexors with band Sit with your left / right leg  extended. Loop a rubber exercise band or tube around the ball of your left / right foot. The ball of your foot is on the walking surface, right under your toes. While holding both ends of the band or tube, slowly point your toes downward, pushing them away from you. Hold this position for 3 seconds. Slowly return your foot to the starting position and repeat for a total of 10 repetitions. Repeat 2 times. Complete this exercise 3 times per week.  Exercise D: Plantar flexors, standing Stand with your feet shoulder-width apart. Place your hands on a wall or table to steady yourself as needed, but try not to use it very much for support. Rise up on your toes. If this exercise is too easy, try these options: Shift your weight toward your left / right leg until you feel challenged. If told by your health care provider, stand on your left / right foot only. Hold this position for 3 seconds. Repeat for a total of 10 repetitions. Repeat 2 times. Complete this exercise 3 times per week.  Exercise E: Plantar flexors, eccentric Stand on the balls of your feet on the edge of a step. The ball of your foot is on the walking surface, right under your toes. Place your hands on a wall or railing for balance as needed, but try not to lean on it for support. Rise up on your toes, using both  legs to help. Slowly shift all of your weight to your left / right foot and lift your other foot off the step. Slowly lower your left / right heel so it drops below the level of the step. You will feel a slight stretch in your left / right calf. Put your other foot back onto the step. Repeat 2 times. Complete this exercise 3 times per week. This information is not intended to replace advice given to you by your health care provider. Make sure you discuss any questions you have with your health care provider.

## 2021-09-24 ENCOUNTER — Emergency Department (HOSPITAL_BASED_OUTPATIENT_CLINIC_OR_DEPARTMENT_OTHER): Payer: Federal, State, Local not specified - PPO

## 2021-09-24 ENCOUNTER — Emergency Department (HOSPITAL_BASED_OUTPATIENT_CLINIC_OR_DEPARTMENT_OTHER)
Admission: EM | Admit: 2021-09-24 | Discharge: 2021-09-24 | Disposition: A | Discharge status: Home / Self Care | Payer: Federal, State, Local not specified - PPO | Attending: Emergency Medicine | Admitting: Emergency Medicine

## 2021-09-24 ENCOUNTER — Other Ambulatory Visit: Payer: Self-pay

## 2021-09-24 ENCOUNTER — Encounter (HOSPITAL_BASED_OUTPATIENT_CLINIC_OR_DEPARTMENT_OTHER): Payer: Self-pay | Admitting: Emergency Medicine

## 2021-09-24 DIAGNOSIS — I10 Essential (primary) hypertension: Secondary | ICD-10-CM | POA: Insufficient documentation

## 2021-09-24 DIAGNOSIS — R519 Headache, unspecified: Secondary | ICD-10-CM

## 2021-09-24 DIAGNOSIS — Z79899 Other long term (current) drug therapy: Secondary | ICD-10-CM | POA: Insufficient documentation

## 2021-09-24 LAB — CBC WITH DIFFERENTIAL/PLATELET
Abs Immature Granulocytes: 0.01 10*3/uL (ref 0.00–0.07)
Basophils Absolute: 0 10*3/uL (ref 0.0–0.1)
Basophils Relative: 1 %
Eosinophils Absolute: 0.1 10*3/uL (ref 0.0–0.5)
Eosinophils Relative: 1 %
HCT: 41.4 % (ref 36.0–46.0)
Hemoglobin: 13.5 g/dL (ref 12.0–15.0)
Immature Granulocytes: 0 %
Lymphocytes Relative: 37 %
Lymphs Abs: 1.8 10*3/uL (ref 0.7–4.0)
MCH: 29.2 pg (ref 26.0–34.0)
MCHC: 32.6 g/dL (ref 30.0–36.0)
MCV: 89.6 fL (ref 80.0–100.0)
Monocytes Absolute: 0.3 10*3/uL (ref 0.1–1.0)
Monocytes Relative: 5 %
Neutro Abs: 2.8 10*3/uL (ref 1.7–7.7)
Neutrophils Relative %: 56 %
Platelets: 263 10*3/uL (ref 150–400)
RBC: 4.62 MIL/uL (ref 3.87–5.11)
RDW: 13.9 % (ref 11.5–15.5)
WBC: 5 10*3/uL (ref 4.0–10.5)
nRBC: 0 % (ref 0.0–0.2)

## 2021-09-24 LAB — BASIC METABOLIC PANEL
Anion gap: 6 (ref 5–15)
BUN: 13 mg/dL (ref 8–23)
CO2: 30 mmol/L (ref 22–32)
Calcium: 9 mg/dL (ref 8.9–10.3)
Chloride: 104 mmol/L (ref 98–111)
Creatinine, Ser: 0.99 mg/dL (ref 0.44–1.00)
GFR, Estimated: 58 mL/min — ABNORMAL LOW (ref >60)
Glucose, Bld: 90 mg/dL (ref 70–99)
Potassium: 3.6 mmol/L (ref 3.5–5.1)
Sodium: 140 mmol/L (ref 135–145)

## 2021-09-24 MED ORDER — AMLODIPINE BESYLATE 5 MG PO TABS
10.0000 mg | ORAL_TABLET | Freq: Once | ORAL | Status: AC
  Administered 2021-09-24: 10 mg via ORAL
  Filled 2021-09-24: qty 2

## 2021-09-24 MED ORDER — IOHEXOL 350 MG/ML SOLN
75.0000 mL | Freq: Once | INTRAVENOUS | Status: AC | PRN
Start: 2021-09-24 — End: 2021-09-24
  Administered 2021-09-24: 75 mL via INTRAVENOUS

## 2021-09-24 NOTE — ED Notes (Signed)
Patient transported to CT 

## 2021-09-24 NOTE — ED Triage Notes (Signed)
Pt reports L side pounding headache since last night. Grips strong and equal. Not arm drift. Voice clear.

## 2021-09-24 NOTE — Discharge Instructions (Addendum)
Make an appointment to follow-up with your primary care doctor for blood pressure follow-up.  Chart has been provided where you can document your blood pressure daily.  Specialist CT head and neck without any acute findings.  Your primary care doctor may want to get special ultrasound study of your carotid arteries for further evaluation.  Return for any new or worse symptoms.  Continue take your blood pressure medicine.

## 2021-09-24 NOTE — ED Provider Notes (Addendum)
[facility] EMERGENCY DEPARTMENT Provider Note   CSN: [id number] Arrival date & time: 09/24/21  1422     History  Chief Complaint  Patient presents with   Headache    Felicia Yu is a 81 y.o. female.  Patient with onset of left-sided headache around 2000 last evening.  It would come and go.  It was pounding in nature.  Not associated with any numbness or weakness or visual changes or speech problems.  No history of migraines or chronic headaches.  Does have a history of hypertension.  Is on Norvasc for that.  No change in medication.  Patient did not take her Norvasc today.    Past medical history significant for high cholesterol hypertension systolic ejection murmur.    Patient states that the headache is currently not present      Home Medications Prior to Admission medications   Medication Sig Start Date End Date Taking? Authorizing Provider  amLODipine (NORVASC) 10 MG tablet Take 1 tablet (10 mg total) by mouth daily. 07/06/21   Shelda Pal, DO  OVER THE COUNTER MEDICATION Vitamin D3 800mg -Take 1 tablet by mouth twice a day.    [provider]  rosuvastatin (CRESTOR) 10 MG tablet Take 1 tablet (10 mg total) by mouth daily. 10/14/19   Shelda Pal, DO      Allergies    Lisinopril    Review of Systems   Review of Systems  Constitutional:  Negative for chills and fever.  HENT:  Negative for congestion, ear pain and sore throat.   Eyes:  Negative for pain and visual disturbance.  Respiratory:  Negative for cough and shortness of breath.   Cardiovascular:  Negative for chest pain and palpitations.  Gastrointestinal:  Negative for abdominal pain and vomiting.  Genitourinary:  Negative for dysuria and hematuria.  Musculoskeletal:  Negative for arthralgias and back pain.  Skin:  Negative for color change and rash.  Neurological:  Positive for headaches. Negative for dizziness, tremors, seizures, syncope, facial asymmetry,  speech difficulty, weakness, light-headedness and numbness.  All other systems reviewed and are negative.  Physical Exam Updated Vital Signs BP (!) 196/80    Pulse 68    Temp 98.1 F (36.7 C) (Oral)    Resp 18    SpO2 100%  Physical Exam  ED Results / Procedures / Treatments   Labs (all labs ordered are listed, but only abnormal results are displayed) Labs Reviewed  BASIC METABOLIC PANEL - Abnormal; Notable for the following components:      Result Value   GFR, Estimated 58 (*)    All other components within normal limits  CBC WITH DIFFERENTIAL/PLATELET    EKG EKG Interpretation  Date/Time:  Saturday September 24 2021 15:49:05 EST Ventricular Rate:  68 PR Interval:  172 QRS Duration: 85 QT Interval:  412 QTC Calculation: 439 R Axis:   40 Text Interpretation: Sinus rhythm Confirmed by Fredia Sorrow [phone]) on 09/24/2021 3:53:04 PM  Radiology CT ANGIO HEAD NECK W WO CM  Result Date: 09/24/2021 CLINICAL DATA:  Headache, sudden, severe Subarachnoid hemorrhage [facility]) EXAM: CT ANGIOGRAPHY HEAD AND NECK TECHNIQUE: Multidetector CT imaging of the head and neck was performed using the standard protocol during bolus administration of intravenous contrast. Multiplanar CT image reconstructions and MIPs were obtained to evaluate the vascular anatomy. Carotid stenosis measurements (when applicable) are obtained utilizing NASCET criteria, using the distal internal carotid diameter as the denominator. RADIATION DOSE REDUCTION: This exam was performed according to the  departmental dose-optimization program which includes automated exposure control, adjustment of the mA and/or kV according to patient size and/or use of iterative reconstruction technique. CONTRAST:  107mL OMNIPAQUE IOHEXOL 350 MG/ML SOLN COMPARISON:  None. FINDINGS: CT HEAD FINDINGS Brain: No evidence of acute infarction, hemorrhage, hydrocephalus, extra-axial collection or mass lesion/mass effect. Vascular: No hyperdense vessel  identified. Skull: No acute fracture Sinuses: Visualized sinuses are clear. Orbits: No acute finding. Review of the MIP images confirms the above findings CTA NECK FINDINGS Aortic arch: Great vessel origins are patent without significant stenosis. Right carotid system: Mild atherosclerosis at the carotid bifurcation without significant (greater than 50%) stenosis. Mild irregularity at the skull base. Tortuous ICA at the skull base. Left carotid system: Mild atherosclerosis at the carotid bifurcation without significant (greater than 50%) stenosis. Vertebral arteries: Bilateral vertebral arteries are patent without significant (greater than 50%) stenosis. Skeleton: Moderate multilevel degenerative change. Other neck: No evidence of acute abnormality on limited assessment. Upper chest: Visualized lung apices are clear. Review of the MIP images confirms the above findings CTA HEAD FINDINGS Anterior circulation: Calcific atherosclerosis of bilateral intracranial ICAs without significant stenosis bilateral MCAs and ACAs are patent without proximal hemodynamically significant stenosis. Distal MCAs are difficult to follow due to venous contamination. Posterior circulation: Left dominant intradural vertebral artery. Bilateral intradural vertebral arteries and basilar artery are patent without significant stenosis. Small P1 PCAs bilaterally with posterior communicating arteries, anatomic variant. Bilateral PCAs are patent without proximal hemodynamically significant stenosis. Distal PCAs are difficult to visualize due to venous contamination. Venous sinuses: As permitted by contrast timing, patent. Anatomic variants: Detailed above. Review of the MIP images confirms the above findings IMPRESSION: CT head: No evidence of acute intracranial abnormality. CTA head: Study limited by venous timing/contamination without large vessel occlusion or proximal hemodynamically significant stenosis. CTA neck: 1. No significant (greater  than 50%) stenosis. 2. Mild irregularity of bilateral ICAs at the skull base, potentially related to fibromuscular dysplasia or atherosclerosis. Electronically Signed   By: Margaretha Sheffield M.D.   On: 09/24/2021 17:37    Procedures Procedures  EKG without any significant findings were acute changes.  Medications Ordered in ED Medications  amLODipine (NORVASC) tablet 10 mg (10 mg Oral Given 09/24/21 1649)  iohexol (OMNIPAQUE) 350 MG/ML injection 75 mL (75 mLs Intravenous Contrast Given 09/24/21 1656)    ED Course/ Medical Decision Making/ A&P                           Medical Decision Making Amount and/or Complexity of Data Reviewed Labs: ordered. Radiology: ordered.  Risk Prescription drug management.  Patient CBC White blood cell count 5.0 hemoglobin 13.5.  Basic metabolic panel without any significant abnormalities other than GFR at 58 so a little bit less than 60.  CT angio head with some limitations but no gross abnormalities.  Certainly no evidence of any head bleed.  No evidence of any aneurysms.  CT neck had some mild irregularities in bilateral internal carotid arteries at the skull base.  Patient without a headache since she has been here  Patient's blood pressure here has been elevated predominantly has been in a systolic hypertension.  Her Norvasc was given since she did not have it today.  Pressure still in the 190s.  We will have her primary care doctor trend that.  Will not make any changes in her medication based on this 1 time day of elevated blood pressures.  In addition no concerns for stroke.  Patient has no neurodeficits at all.  CT angio head neck has successfully ruled out subarachnoid hemorrhage or any aneurysms.     Final Clinical Impression(s) / ED Diagnoses Final diagnoses:  Primary hypertension  Acute nonintractable headache, unspecified headache type    Rx / DC Orders ED Discharge Orders     None         Fredia Sorrow, MD 09/24/21  Ladoris Gene    Fredia Sorrow, MD 09/24/21 [phone]

## 2021-12-06 ENCOUNTER — Telehealth: Payer: Self-pay

## 2021-12-06 NOTE — Telephone Encounter (Signed)
Pt lvm stating she needs to reschedule her appt with Dr. Ashley Royalty. Per the message, she's been trying to contact someone in the office for the past 2 days.  ? ?Message sent to Scheduler. ?

## 2021-12-06 NOTE — Telephone Encounter (Signed)
Spoke with patient and she said she did not want to cancel or reschedule appointment, that she just needed the address for her. Given address to patient and verified appt with her. AMUCK ?

## 2021-12-07 ENCOUNTER — Ambulatory Visit: Payer: Federal, State, Local not specified - PPO | Admitting: Family Medicine

## 2021-12-07 ENCOUNTER — Encounter: Payer: Self-pay | Admitting: Family Medicine

## 2021-12-07 VITALS — BP 155/76 | HR 73 | Ht 64.0 in | Wt 178.0 lb

## 2021-12-07 DIAGNOSIS — I10 Essential (primary) hypertension: Secondary | ICD-10-CM

## 2021-12-07 DIAGNOSIS — E785 Hyperlipidemia, unspecified: Secondary | ICD-10-CM

## 2021-12-07 DIAGNOSIS — K3189 Other diseases of stomach and duodenum: Secondary | ICD-10-CM | POA: Insufficient documentation

## 2021-12-07 MED ORDER — AMLODIPINE BESYLATE 10 MG PO TABS: 10.0000 mg | ORAL_TABLET | Freq: Every day | ORAL | 2 refills | Status: DC

## 2021-12-07 MED ORDER — ROSUVASTATIN CALCIUM 10 MG PO TABS
10.0000 mg | ORAL_TABLET | Freq: Every day | ORAL | 2 refills | Status: DC
Start: 2021-12-07 — End: 2023-06-19

## 2021-12-07 NOTE — Assessment & Plan Note (Signed)
Tolerating rosuvastatin well at current strength.  We will plan to continue this and update lipid panel at next visit. ?

## 2021-12-07 NOTE — Assessment & Plan Note (Signed)
Blood pressure elevated today.  She reports better readings at home and declines any additional medication at this time.  She will continue amlodipine 10 mg daily. ?

## 2021-12-07 NOTE — Patient Instructions (Signed)
Nice to meet you today! ?I have placed a referral to gastroenterology.  ?Please follow up with me in 6 months or sooner if needed.  ?

## 2021-12-07 NOTE — Assessment & Plan Note (Signed)
She prefers to have a second opinion before proceeding with endoscopic ultrasound and excision.  Referral placed. ?

## 2021-12-07 NOTE — Progress Notes (Signed)
?Roz[place] - 81 y.o. female MRN [id number]  Date of birth: 10/18/1940 ? ?Subjective ?Chief Complaint  ?Patient presents with  ? Establish Care  ? ? ?HPI ?[place] P Lerette is a 81 year old female here today for initial visit to establish care.  She has history of hypertension that is currently managed with amlodipine 10 mg daily.  She reports she is taking this as directed.  She is also taking rosuvastatin 10 mg daily for history of hyperlipidemia.  Recently had endoscopy showing gastric nodule.  Recommended endoscopic ultrasound with excision previously however she declined.  She was referred for second opinion however reports that she never went to this appointment.  She feels like she has had this for quite some time.  She has had some increased reflux symptoms but otherwise no other changes. ? ?ROS:  A comprehensive ROS was completed and negative except as noted per HPI ?Allergies  ?Allergen Reactions  ? Lisinopril Other (See Comments)  ?  Facial edema & blurred vision  ? ? ?Past Medical History:  ?Diagnosis Date  ? Essential hypertension, benign 08/18/2013  ? Family hx of colon cancer   ? High cholesterol   ? Systolic ejection murmur 01/18/2017  ? ? ?Past Surgical History:  ?Procedure Laterality Date  ? NO PAST SURGERIES    ? ? ?Social History  ? ?Socioeconomic History  ? Marital status: Divorced  ?  Spouse name: Not on file  ? Number of children: Not on file  ? Years of education: Not on file  ? Highest education level: Not on file  ?Occupational History  ? Not on file  ?Tobacco Use  ? Smoking status: Never  ?  Passive exposure: Never  ? Smokeless tobacco: Never  ?Vaping Use  ? Vaping Use: Never used  ?Substance and Sexual Activity  ? Alcohol use: No  ? Drug use: No  ? Sexual activity: Never  ?  Partners: Female  ?Other Topics Concern  ? Not on file  ?Social History Narrative  ? ** Merged History Encounter **  ?    ? ?Social Determinants of Health  ? ?Financial Resource Strain: Not on file  ?Food  Insecurity: Not on file  ?Transportation Needs: Not on file  ?Physical Activity: Not on file  ?Stress: Not on file  ?Social Connections: Not on file  ? ? ?Family History  ?Problem Relation Age of Onset  ? Colon cancer Sister   ? Colon cancer Brother   ? Diabetes Other   ? Hyperlipidemia Other   ? Hypertension Other   ? Stroke Neg Hx   ? ? ?Health Maintenance  ?Topic Date Due  ? TETANUS/TDAP  03/09/2022 (Originally 08/28/1959)  ? INFLUENZA VACCINE  03/07/2022  ? Pneumonia Vaccine 63+ Years old  Completed  ? DEXA SCAN  Completed  ? COVID-19 Vaccine  Completed  ? HPV VACCINES  Aged Out  ? Hepatitis C Screening  Discontinued  ? Zoster Vaccines- Shingrix  Discontinued  ? ? ? ?----------------------------------------------------------------------------------------------------------------------------------------------------------------------------------------------------------------- ?Physical Exam ?BP (!) 185/76 (BP Location: Left Arm, Patient Position: Sitting, Cuff Size: Normal)   Pulse 73   Ht 5\' 4"  (1.626 m)   Wt 178 lb (80.7 kg)   SpO2 100%   BMI 30.55 kg/m?  ? ?Physical Exam ?Constitutional:   ?   Appearance: Normal appearance.  ?Eyes:  ?   General: No scleral icterus. ?Cardiovascular:  ?   Rate and Rhythm: Normal rate and regular rhythm.  ?Pulmonary:  ?   Effort: Pulmonary effort is  normal.  ?   Breath sounds: Normal breath sounds.  ?Musculoskeletal:  ?   Cervical back: Neck supple.  ?Neurological:  ?   Mental Status: She is alert.  ?Psychiatric:     ?   Mood and Affect: Mood normal.     ?   Behavior: Behavior normal.  ? ? ?------------------------------------------------------------------------------------------------------------------------------------------------------------------------------------------------------------------- ?Assessment and Plan ? ?Essential hypertension, benign ?Blood pressure elevated today.  She reports better readings at home and declines any additional medication at this time.  She  will continue amlodipine 10 mg daily. ? ?Hyperlipidemia ?Tolerating rosuvastatin well at current strength.  We will plan to continue this and update lipid panel at next visit. ? ?Gastric nodule ?She prefers to have a second opinion before proceeding with endoscopic ultrasound and excision.  Referral placed. ? ? ?Meds ordered this encounter  ?Medications  ? amLODipine (NORVASC) 10 MG tablet  ?  Sig: Take 1 tablet (10 mg total) by mouth daily.  ?  Dispense:  90 tablet  ?  Refill:  2  ? rosuvastatin (CRESTOR) 10 MG tablet  ?  Sig: Take 1 tablet (10 mg total) by mouth daily.  ?  Dispense:  90 tablet  ?  Refill:  2  ? ? ?Return in about 6 months (around 06/09/2022) for HTN/HLD. ? ? ? ?This visit occurred during the SARS-CoV-2 public health emergency.  Safety protocols were in place, including screening questions prior to the visit, additional usage of staff PPE, and extensive cleaning of exam room while observing appropriate contact time as indicated for disinfecting solutions.  ? ?

## 2022-01-31 ENCOUNTER — Encounter: Payer: Self-pay | Admitting: Family Medicine

## 2022-02-28 ENCOUNTER — Emergency Department (HOSPITAL_BASED_OUTPATIENT_CLINIC_OR_DEPARTMENT_OTHER): Payer: Federal, State, Local not specified - PPO

## 2022-02-28 ENCOUNTER — Other Ambulatory Visit: Payer: Self-pay

## 2022-02-28 ENCOUNTER — Emergency Department (HOSPITAL_BASED_OUTPATIENT_CLINIC_OR_DEPARTMENT_OTHER)
Admission: EM | Admit: 2022-02-28 | Discharge: 2022-02-28 | Disposition: A | Discharge status: Home / Self Care | Payer: Federal, State, Local not specified - PPO | Attending: Emergency Medicine | Admitting: Emergency Medicine

## 2022-02-28 ENCOUNTER — Encounter (HOSPITAL_BASED_OUTPATIENT_CLINIC_OR_DEPARTMENT_OTHER): Payer: Self-pay | Admitting: Emergency Medicine

## 2022-02-28 DIAGNOSIS — M545 Low back pain, unspecified: Secondary | ICD-10-CM | POA: Diagnosis present

## 2022-02-28 DIAGNOSIS — N3000 Acute cystitis without hematuria: Secondary | ICD-10-CM

## 2022-02-28 DIAGNOSIS — Z20822 Contact with and (suspected) exposure to covid-19: Secondary | ICD-10-CM | POA: Insufficient documentation

## 2022-02-28 DIAGNOSIS — R197 Diarrhea, unspecified: Secondary | ICD-10-CM | POA: Insufficient documentation

## 2022-02-28 DIAGNOSIS — R531 Weakness: Secondary | ICD-10-CM | POA: Diagnosis not present

## 2022-02-28 DIAGNOSIS — G8911 Acute pain due to trauma: Secondary | ICD-10-CM | POA: Diagnosis not present

## 2022-02-28 DIAGNOSIS — W19XXXA Unspecified fall, initial encounter: Secondary | ICD-10-CM | POA: Diagnosis not present

## 2022-02-28 LAB — CBC
HCT: 40.3 % (ref 36.0–46.0)
Hemoglobin: 13.5 g/dL (ref 12.0–15.0)
MCH: 29.5 pg (ref 26.0–34.0)
MCHC: 33.5 g/dL (ref 30.0–36.0)
MCV: 88.2 fL (ref 80.0–100.0)
Platelets: 265 10*3/uL (ref 150–400)
RBC: 4.57 MIL/uL (ref 3.87–5.11)
RDW: 13.5 % (ref 11.5–15.5)
WBC: 6.1 10*3/uL (ref 4.0–10.5)
nRBC: 0 % (ref 0.0–0.2)

## 2022-02-28 LAB — BASIC METABOLIC PANEL
Anion gap: 6 (ref 5–15)
BUN: 20 mg/dL (ref 8–23)
CO2: 26 mmol/L (ref 22–32)
Calcium: 9.2 mg/dL (ref 8.9–10.3)
Chloride: 105 mmol/L (ref 98–111)
Creatinine, Ser: 1.16 mg/dL — ABNORMAL HIGH (ref 0.44–1.00)
GFR, Estimated: 48 mL/min — ABNORMAL LOW (ref >60)
Glucose, Bld: 101 mg/dL — ABNORMAL HIGH (ref 70–99)
Potassium: 3.9 mmol/L (ref 3.5–5.1)
Sodium: 137 mmol/L (ref 135–145)

## 2022-02-28 LAB — URINALYSIS, MICROSCOPIC (REFLEX)

## 2022-02-28 LAB — URINALYSIS, ROUTINE W REFLEX MICROSCOPIC
Glucose, UA: NEGATIVE mg/dL
Hgb urine dipstick: NEGATIVE
Ketones, ur: 40 mg/dL — AB
Nitrite: NEGATIVE
Protein, ur: 100 mg/dL — AB
Specific Gravity, Urine: 1.015 (ref 1.005–1.030)
pH: 8.5 — ABNORMAL HIGH (ref 5.0–8.0)

## 2022-02-28 LAB — SARS CORONAVIRUS 2 BY RT PCR: SARS Coronavirus 2 by RT PCR: NEGATIVE

## 2022-02-28 MED ORDER — CEPHALEXIN 500 MG PO CAPS: 500.0000 mg | ORAL_CAPSULE | Freq: Two times a day (BID) | ORAL | 0 refills | Status: DC

## 2022-02-28 MED ORDER — CEPHALEXIN 250 MG PO CAPS
500.0000 mg | ORAL_CAPSULE | Freq: Once | ORAL | Status: AC
  Administered 2022-02-28: 500 mg via ORAL
  Filled 2022-02-28: qty 2

## 2022-02-28 MED ORDER — MELOXICAM 7.5 MG PO TABS: 7.5000 mg | ORAL_TABLET | Freq: Every day | ORAL | 0 refills | Status: DC

## 2022-02-28 MED ORDER — KETOROLAC TROMETHAMINE 60 MG/2ML IM SOLN
30.0000 mg | Freq: Once | INTRAMUSCULAR | Status: DC
  Filled 2022-02-28: qty 2

## 2022-02-28 MED ORDER — IBUPROFEN 400 MG PO TABS
400.0000 mg | ORAL_TABLET | Freq: Once | ORAL | Status: AC
  Administered 2022-02-28: 400 mg via ORAL
  Filled 2022-02-28: qty 1

## 2022-02-28 MED ORDER — LIDOCAINE 5 % EX PTCH: 1.0000 | MEDICATED_PATCH | CUTANEOUS | 0 refills | Status: DC

## 2022-02-28 NOTE — ED Provider Notes (Signed)
[facility] EMERGENCY DEPARTMENT Provider Note   CSN: [id number] Arrival date & time: 02/28/22  1540     History  Chief Complaint  Patient presents with   Back Pain   Diarrhea   Weakness    Brennen P Diles is a 81 y.o. female.  Patient is a 81 year old female who presents with back pain.  She says she has had some pain in her lower back that started about 3 days ago.  Is been gradually worsening.  There is no radiation down her legs.  No numbness or weakness to her legs.  No loss of bowel or bladder control.  She does workout at the gym about 4 days a week and says she recently used a new machine where she pushes her legs out.  She does not know if she did something at that point.  This morning she was getting out of bed and her back pain was more intense.  She tried to get up and walk and slid down to the floor.  She said she was having a hard time walking because of the pain.  She initially said that her legs gave out on her but she then says that she thinks that she just was having so much pain that she had to lay down.  She did not actually have a fall, she slid to the floor.  She did not hit her head.  No other injuries.  She was able to ambulate into the ED and says that her legs feel like they are working normally.  She does not feel like she has any weakness in her legs.  No numbness in her legs.  No urinary symptoms.  No fevers.  She has had a little bit of nasal congestion and she had some looser stools about 3 times today.  No nausea or vomiting.  No abdominal pain.  No cough or chest congestion.       Home Medications Prior to Admission medications   Medication Sig Start Date End Date Taking? Authorizing Provider  lidocaine (LIDODERM) 5 % Place 1 patch onto the skin daily. Remove & Discard patch within 12 hours or as directed by MD 02/28/22  Yes Rolan Bucco, MD  meloxicam (MOBIC) 7.5 MG tablet Take 1 tablet (7.5 mg total) by mouth daily. 02/28/22  Yes Rolan Bucco, MD  amLODipine (NORVASC) 10 MG tablet Take 1 tablet (10 mg total) by mouth daily. 12/07/21   Everrett Coombe, DO  OVER THE COUNTER MEDICATION Vitamin D3 800mg -Take 1 tablet by mouth twice a day. Patient not taking: Reported on 12/07/2021    [provider]  rosuvastatin (CRESTOR) 10 MG tablet Take 1 tablet (10 mg total) by mouth daily. 12/07/21   02/06/22, DO      Allergies    Lisinopril    Review of Systems   Review of Systems  Constitutional:  Negative for chills, diaphoresis, fatigue and fever.  HENT:  Positive for rhinorrhea. Negative for congestion and sneezing.   Eyes: Negative.   Respiratory:  Negative for cough, chest tightness and shortness of breath.   Cardiovascular:  Negative for chest pain and leg swelling.  Gastrointestinal:  Negative for abdominal pain, blood in stool, diarrhea, nausea and vomiting.       Loose stools but no frank diarrhea  Genitourinary:  Negative for difficulty urinating, flank pain, frequency and hematuria.  Musculoskeletal:  Positive for back pain. Negative for arthralgias.  Skin:  Negative for rash.  Neurological:  Negative  for dizziness, speech difficulty, weakness, numbness and headaches.    Physical Exam Updated Vital Signs BP (!) 189/78   Pulse 79   Temp 97.7 F (36.5 C) (Oral)   Resp 18   Ht 5\' 4"  (1.626 m)   Wt 74.8 kg   SpO2 100%   BMI 28.32 kg/m  Physical Exam Constitutional:      Appearance: She is well-developed.  HENT:     Head: Normocephalic and atraumatic.  Eyes:     Pupils: Pupils are equal, round, and reactive to light.  Cardiovascular:     Rate and Rhythm: Normal rate and regular rhythm.     Heart sounds: Normal heart sounds.  Pulmonary:     Effort: Pulmonary effort is normal. No respiratory distress.     Breath sounds: Normal breath sounds. No wheezing or rales.  Chest:     Chest wall: No tenderness.  Abdominal:     General: Bowel sounds are normal.     Palpations: Abdomen is soft.      Tenderness: There is no abdominal tenderness. There is no guarding or rebound.  Musculoskeletal:        General: Normal range of motion.     Cervical back: Normal range of motion and neck supple.     Comments: Positive tenderness to the mid lumbar spine and a little bit to the right paraspinal area.  There is negative straight leg raise bilaterally although the patient has increased pain when she lifts her right leg as compared to her left leg.  The pain is in her lower back.  She has normal sensation to lower extremities bilaterally.  Motor 5 out of 5 to lower extremities bilaterally.  Pedal pulses are intact.  Patellar reflexes symmetric bilaterally  Lymphadenopathy:     Cervical: No cervical adenopathy.  Skin:    General: Skin is warm and dry.     Findings: No rash.  Neurological:     General: No focal deficit present.     Mental Status: She is alert and oriented to person, place, and time.     ED Results / Procedures / Treatments   Labs (all labs ordered are listed, but only abnormal results are displayed) Labs Reviewed  URINALYSIS, ROUTINE W REFLEX MICROSCOPIC - Abnormal; Notable for the following components:      Result Value   APPearance HAZY (*)    pH 8.5 (*)    Bilirubin Urine SMALL (*)    Ketones, ur 40 (*)    Protein, ur 100 (*)    Leukocytes,Ua LARGE (*)    All other components within normal limits  BASIC METABOLIC PANEL - Abnormal; Notable for the following components:   Glucose, Bld 101 (*)    Creatinine, Ser 1.16 (*)    GFR, Estimated 48 (*)    All other components within normal limits  URINALYSIS, MICROSCOPIC (REFLEX) - Abnormal; Notable for the following components:   Bacteria, UA RARE (*)    All other components within normal limits  SARS CORONAVIRUS 2 BY RT PCR  CBC    EKG None  Radiology CT Lumbar Spine Wo Contrast  Result Date: 02/28/2022 CLINICAL DATA:  Low back pain after falling today. Increased fracture risk. EXAM: CT LUMBAR SPINE WITHOUT  CONTRAST TECHNIQUE: Multidetector CT imaging of the lumbar spine was performed without intravenous contrast administration. Multiplanar CT image reconstructions were also generated. RADIATION DOSE REDUCTION: This exam was performed according to the departmental dose-optimization program which includes automated exposure control, adjustment of  the mA and/or kV according to patient size and/or use of iterative reconstruction technique. COMPARISON:  Lumbar spine radiographs same date. Report only from abdominopelvic CT 03/08/2010. FINDINGS: Segmentation: There are 5 lumbar type vertebral bodies. Alignment: Normal. Vertebrae: No evidence of acute fracture, pars defect or traumatic subluxation. Mild irregularity of the right L1 transverse process is likely developmental or secondary to an old injury. Facet degenerative changes are present inferiorly. The lumbar disc heights are relatively preserved at each level. There are mild sacroiliac degenerative changes bilaterally. Paraspinal and other soft tissues: The paraspinal soft tissues appear unremarkable. Minimal aortic atherosclerosis. Nonobstructing calculus in the interpolar region of the left kidney, measuring up to 7 mm in diameter. Disc levels: T12-L1: Mild disc bulging with anterior osteophytes. No spinal stenosis. L1-2: Mild disc bulging with paraspinal osteophytes. No spinal stenosis. L2-3: Mild disc bulging with small anterior paraspinal osteophytes. No spinal stenosis. L3-4: Mild disc bulging and facet hypertrophy. No significant spinal stenosis. L4-5: Mild disc bulging with mild to moderate facet and ligamentous hypertrophy. No significant spinal stenosis. L5-S1: Mild disc bulging with moderate bilateral facet hypertrophy. No significant spinal stenosis. IMPRESSION: 1. No evidence of acute lumbar spine fracture or traumatic subluxation. 2. Mild disc bulging throughout the lumbar spine with relatively preserved disc heights. No significant disc herniation or  spinal stenosis identified. 3. Facet degenerative changes inferiorly, moderate at L5-S1. 4. Nonobstructing left renal calculus. Mild Aortic Atherosclerosis (ICD10-I70.0). Electronically Signed   By: Carey Bullocks M.D.   On: 02/28/2022 19:39   DG Lumbar Spine Complete  Result Date: 02/28/2022 CLINICAL DATA:  Low back pain, fall today. Diarrhea. Left renal calculus. EXAM: LUMBAR SPINE - COMPLETE 4+ VIEW COMPARISON:  None Available. FINDINGS: 1.0 cm in long axis calcification projecting of the left renal shadow compatible with renal calculus. There is formed stool in the colon. Degenerative facet arthropathy noted bilaterally at L4-5 and L5-S1. Anterior interbody spurring at each level between T12 and L3. No cortical discontinuity to indicate fracture in the lumbar spine or visualized sacrum. No significant subluxation. IMPRESSION: 1. No fracture or lumbar spine subluxation identified. 2. Left nephrolithiasis. 3. Lumbar spondylosis. Electronically Signed   By: Gaylyn Rong M.D.   On: 02/28/2022 16:44    Procedures Procedures    Medications Ordered in ED Medications  ibuprofen (ADVIL) tablet 400 mg (has no administration in time range)    ED Course/ Medical Decision Making/ A&P                           Medical Decision Making Amount and/or Complexity of Data Reviewed Labs: ordered. Radiology: ordered.  Risk Prescription drug management.   Patient is a 81 year old female who presents with back pain.  She does not have any neurologic deficits.  No signs of cauda equina.  No recent trauma other than working out at the gym.  She had x-rays which showed no acute abnormality.  These were interpreted by me.  CT scan of the lumbar spine shows no obvious significant disc bulging or fractures.  She does have a stone in the left kidney but no ureteral stones.  Do not think that this is contributing to her pain.  She declined the need for any pain medication in the ED other than ibuprofen.  She  was reluctant to start medications although in discussions with her it sounds like she is having a hard time at home with the pain.  We will go ahead and try Mobic  and Lidoderm patches.  She is amenable to this.  She is able to ambulate.  She drove herself to the ED and walked into the ED and says that her walking is doing fine.  Her urinalysis does show some suggestions of infection.  Was started on Keflex.  Her COVID test is negative.  Her other labs are nonconcerning.  Her creatinine is 1.16 which is similar to prior values on chart review.  She was encouraged to have close follow-up with her primary care doctor for recheck.  Return precautions were given.  Final Clinical Impression(s) / ED Diagnoses Final diagnoses:  Acute midline low back pain without sciatica    Rx / DC Orders ED Discharge Orders          Ordered    meloxicam (MOBIC) 7.5 MG tablet  Daily        02/28/22 2053    lidocaine (LIDODERM) 5 %  Every 24 hours        02/28/22 2053              Rolan Bucco, MD 02/28/22 2101

## 2022-02-28 NOTE — ED Triage Notes (Signed)
C/o lower back pain and diarrhea that started today. Had a fall today after her "legs gave out"  Denies loc, hitting head, blood thinners. Pt drove herself here.

## 2022-02-28 NOTE — ED Provider Triage Note (Signed)
Emergency Medicine Provider Triage Evaluation Note  Katheren Shams , a 81 y.o. female  was evaluated in triage.  Pt complains of multiple complaints. Patient states she is having lower back pain that caused her legs to give out this morning when she got out of bed. Patient also complaining of generalized body aches, fatigue. Patient denies fevers, IVDU, nausea, vomiting. Patient also endorsing diarrhea but denies abdominal pain.   Review of Systems  Positive:  Negative:   Physical Exam  BP (!) 145/99 (BP Location: Left Arm)   Pulse 90   Temp 97.6 F (36.4 C) (Oral)   Resp 18   Ht 5\' 4"  (1.626 m)   Wt 74.8 kg   SpO2 100%   BMI 28.32 kg/m  Gen:   Awake, no distress   Resp:  Normal effort  MSK:   Moves extremities without difficulty  Other:  All 4 quadrants of abdomen soft and compressible. No focal neurodeficits on exam.   Medical Decision Making  Medically screening exam initiated at 4:38 PM.  Appropriate orders placed.  P Scheer was informed that the remainder of the evaluation will be completed by another provider, this initial triage assessment does not replace that evaluation, and the importance of remaining in the ED until their evaluation is complete.     [place], PA-C 02/28/22 1643

## 2022-02-28 NOTE — ED Notes (Signed)
FT 1

## 2022-03-01 ENCOUNTER — Telehealth: Payer: Self-pay | Admitting: General Practice

## 2022-03-01 NOTE — Telephone Encounter (Signed)
Transition Care Management Unsuccessful Follow-up Telephone Call  Date of discharge and from where:  02/28/22 from [facility]  Attempts:  1st Attempt  Reason for unsuccessful TCM follow-up call:  Left voice message

## 2022-03-07 NOTE — Telephone Encounter (Signed)
Transition Care Management Unsuccessful Follow-up Telephone Call  Date of discharge and from where:  02/28/22 from [facility]  Attempts:  2nd Attempt  Reason for unsuccessful TCM follow-up call:  Left voice message

## 2022-03-08 NOTE — Telephone Encounter (Signed)
Transition Care Management Unsuccessful Follow-up Telephone Call  Date of discharge and from where:  02/28/22 from [facility]  Attempts:  3rd Attempt  Reason for unsuccessful TCM follow-up call:  Left voice message

## 2022-06-24 ENCOUNTER — Emergency Department (HOSPITAL_COMMUNITY)
Admission: EM | Admit: 2022-06-24 | Discharge: 2022-06-25 | Disposition: A | Discharge status: Home / Self Care | Payer: Federal, State, Local not specified - PPO | Attending: Emergency Medicine | Admitting: Emergency Medicine

## 2022-06-24 ENCOUNTER — Other Ambulatory Visit: Payer: Self-pay

## 2022-06-24 DIAGNOSIS — Z79899 Other long term (current) drug therapy: Secondary | ICD-10-CM | POA: Insufficient documentation

## 2022-06-24 DIAGNOSIS — Z20822 Contact with and (suspected) exposure to covid-19: Secondary | ICD-10-CM | POA: Insufficient documentation

## 2022-06-24 DIAGNOSIS — R1110 Vomiting, unspecified: Secondary | ICD-10-CM | POA: Insufficient documentation

## 2022-06-24 DIAGNOSIS — L03213 Periorbital cellulitis: Secondary | ICD-10-CM | POA: Diagnosis not present

## 2022-06-24 DIAGNOSIS — R42 Dizziness and giddiness: Secondary | ICD-10-CM | POA: Diagnosis present

## 2022-06-24 DIAGNOSIS — E876 Hypokalemia: Secondary | ICD-10-CM | POA: Diagnosis not present

## 2022-06-24 LAB — CBC WITH DIFFERENTIAL/PLATELET
Abs Immature Granulocytes: 0.04 10*3/uL (ref 0.00–0.07)
Basophils Absolute: 0 10*3/uL (ref 0.0–0.1)
Basophils Relative: 0 %
Eosinophils Absolute: 0 10*3/uL (ref 0.0–0.5)
Eosinophils Relative: 0 %
HCT: 40.9 % (ref 36.0–46.0)
Hemoglobin: 13.4 g/dL (ref 12.0–15.0)
Immature Granulocytes: 1 %
Lymphocytes Relative: 25 %
Lymphs Abs: 1.4 10*3/uL (ref 0.7–4.0)
MCH: 29.4 pg (ref 26.0–34.0)
MCHC: 32.8 g/dL (ref 30.0–36.0)
MCV: 89.7 fL (ref 80.0–100.0)
Monocytes Absolute: 0.3 10*3/uL (ref 0.1–1.0)
Monocytes Relative: 6 %
Neutro Abs: 3.8 10*3/uL (ref 1.7–7.7)
Neutrophils Relative %: 68 %
Platelets: 256 10*3/uL (ref 150–400)
RBC: 4.56 MIL/uL (ref 3.87–5.11)
RDW: 13.2 % (ref 11.5–15.5)
WBC: 5.5 10*3/uL (ref 4.0–10.5)
nRBC: 0 % (ref 0.0–0.2)

## 2022-06-24 LAB — URINALYSIS, ROUTINE W REFLEX MICROSCOPIC
Bilirubin Urine: NEGATIVE
Glucose, UA: NEGATIVE mg/dL
Hgb urine dipstick: NEGATIVE
Ketones, ur: 20 mg/dL — AB
Leukocytes,Ua: NEGATIVE
Nitrite: NEGATIVE
Protein, ur: NEGATIVE mg/dL
Specific Gravity, Urine: 1.011 (ref 1.005–1.030)
pH: 8 (ref 5.0–8.0)

## 2022-06-24 LAB — BASIC METABOLIC PANEL
Anion gap: 12 (ref 5–15)
BUN: 12 mg/dL (ref 8–23)
CO2: 24 mmol/L (ref 22–32)
Calcium: 9.3 mg/dL (ref 8.9–10.3)
Chloride: 106 mmol/L (ref 98–111)
Creatinine, Ser: 1.1 mg/dL — ABNORMAL HIGH (ref 0.44–1.00)
GFR, Estimated: 51 mL/min — ABNORMAL LOW (ref >60)
Glucose, Bld: 143 mg/dL — ABNORMAL HIGH (ref 70–99)
Potassium: 3.3 mmol/L — ABNORMAL LOW (ref 3.5–5.1)
Sodium: 142 mmol/L (ref 135–145)

## 2022-06-24 LAB — HEPATIC FUNCTION PANEL
ALT: 13 U/L (ref 0–44)
AST: 21 U/L (ref 15–41)
Albumin: 3.7 g/dL (ref 3.5–5.0)
Alkaline Phosphatase: 61 U/L (ref 38–126)
Bilirubin, Direct: <0.1 mg/dL (ref 0.0–0.2)
Total Bilirubin: 0.6 mg/dL (ref 0.3–1.2)
Total Protein: 6.9 g/dL (ref 6.5–8.1)

## 2022-06-24 LAB — LIPASE, BLOOD: Lipase: 38 U/L (ref 11–51)

## 2022-06-24 LAB — CBG MONITORING, ED: Glucose-Capillary: 110 mg/dL — ABNORMAL HIGH (ref 70–99)

## 2022-06-24 MED ORDER — ONDANSETRON 4 MG PO TBDP
4.0000 mg | ORAL_TABLET | Freq: Once | ORAL | Status: AC
  Administered 2022-06-25: 4 mg via ORAL
  Filled 2022-06-24: qty 1

## 2022-06-24 MED ORDER — DICYCLOMINE HCL 10 MG PO CAPS
10.0000 mg | ORAL_CAPSULE | Freq: Once | ORAL | Status: AC
  Administered 2022-06-25: 10 mg via ORAL
  Filled 2022-06-24: qty 1

## 2022-06-24 NOTE — ED Triage Notes (Signed)
Pt reports she drank black coffee with baking soda and pistachios, then had onset of vomiting. Pt says that her nausea has subsided but she now feels weak all over.

## 2022-06-24 NOTE — ED Triage Notes (Signed)
Pt arrives via [facility] from home. Per report, the pt took 1/2 tsp of baking soda, coffee and pistachios for weight loss. Just after, she started having nausea and vomiting. No vomiting en route, only c/o was  dizziness. 194/77, hr 71, 99% sats, hx of htn.

## 2022-06-24 NOTE — ED Provider Triage Note (Signed)
Emergency Medicine Provider Triage Evaluation Note  Felicia Yu , a 81 y.o. female  was evaluated in triage.  Pt complains of vomiting. States this evening she drank black coffee with baking soda and pistachios because "someone told me that would be good for my health." . Shortly afterward began having nausea and vomiting and diarrhea. States she has had >5 episodes of non bloody vomiting. States she feels very weak and having ongoing nausea. Did not eat other than that today. Denies abdominal pain, chest pain or shortness of breath  Review of Systems  Positive: See above Negative:   Physical Exam  BP (!) 154/53 (BP Location: Right Arm)   Pulse 68   Temp 97.8 F (36.6 C) (Oral)   Resp 16   SpO2 100%  Gen:   Awake, no distress   Resp:  Normal effort  MSK:   Moves extremities without difficulty  Other:    Medical Decision Making  Medically screening exam initiated at 9:54 PM.  Appropriate orders placed.  Felicia Yu was informed that the remainder of the evaluation will be completed by another provider, this initial triage assessment does not replace that evaluation, and the importance of remaining in the ED until their evaluation is complete.     Cristopher [place], PA-C 06/24/22 2205

## 2022-06-25 ENCOUNTER — Emergency Department (HOSPITAL_COMMUNITY): Payer: Federal, State, Local not specified - PPO

## 2022-06-25 LAB — RESP PANEL BY RT-PCR (FLU A&B, COVID) ARPGX2
Influenza A by PCR: NEGATIVE
Influenza B by PCR: NEGATIVE
SARS Coronavirus 2 by RT PCR: NEGATIVE

## 2022-06-25 LAB — CBG MONITORING, ED: Glucose-Capillary: 117 mg/dL — ABNORMAL HIGH (ref 70–99)

## 2022-06-25 MED ORDER — IOHEXOL 350 MG/ML SOLN
75.0000 mL | Freq: Once | INTRAVENOUS | Status: AC | PRN
  Administered 2022-06-25: 75 mL via INTRAVENOUS

## 2022-06-25 MED ORDER — MECLIZINE HCL 25 MG PO TABS
12.5000 mg | ORAL_TABLET | Freq: Once | ORAL | Status: AC
  Administered 2022-06-25: 12.5 mg via ORAL
  Filled 2022-06-25: qty 1

## 2022-06-25 MED ORDER — LACTATED RINGERS IV BOLUS
1000.0000 mL | Freq: Once | INTRAVENOUS | Status: AC
  Administered 2022-06-25: 1000 mL via INTRAVENOUS

## 2022-06-25 MED ORDER — MECLIZINE HCL 12.5 MG PO TABS: 12.5000 mg | ORAL_TABLET | Freq: Three times a day (TID) | ORAL | 0 refills | Status: DC | PRN

## 2022-06-25 MED ORDER — AMOXICILLIN-POT CLAVULANATE 875-125 MG PO TABS: 1.0000 | ORAL_TABLET | Freq: Two times a day (BID) | ORAL | 0 refills | Status: DC

## 2022-06-25 NOTE — ED Provider Notes (Signed)
[facility] Provider Note   CSN: [id number] Arrival date & time: 06/24/22  2112     History  Chief Complaint  Patient presents with   Emesis    [facility] is a 81 y.o. female.  HPI 81 year old female presents with a chief complaint of vomiting and now is having dizziness.  Patient took a combination of coffee, baking soda and pistachios on a friend's recommendation for health.  Within a couple hours she started vomiting.  After vomiting she knows she was dizzy and off balance.  She also has been noticing some on and off left-sided headache in the parietal region for a few days which recurred last night.  She has been also dealing with a left eye infection and "pinkeye" that she has been treating with over-the-counter drops.  She denies any visual complaints or ocular pain but she feels like there is redness to the skin around her eye that has not improved.  Her biggest concern is that she can no longer walk as she is dizzy and off balance.  Feels like things are moving when she stands up.  Sitting in the stretcher she has no vertigo complaints.  She denies any tinnitus.  Her headache is not currently present.  No blurry/double vision. At the time I am seeing her, she was in the ED waiting room 11+ hours.  Home Medications Prior to Admission medications   Medication Sig Start Date End Date Taking? Authorizing Provider  amoxicillin-clavulanate (AUGMENTIN) 875-125 MG tablet Take 1 tablet by mouth every 12 (twelve) hours. 06/25/22  Yes Pricilla LovelessGoldston, Margree Gimbel, MD  meclizine (ANTIVERT) 12.5 MG tablet Take 1 tablet (12.5 mg total) by mouth 3 (three) times daily as needed for dizziness. 06/25/22  Yes Pricilla LovelessGoldston, Jadarious Dobbins, MD  amLODipine (NORVASC) 10 MG tablet Take 1 tablet (10 mg total) by mouth daily. 12/07/21   Everrett CoombeMatthews, Cody, DO  lidocaine (LIDODERM) 5 % Place 1 patch onto the skin daily. Remove & Discard patch within 12 hours or as directed by MD 02/28/22    Rolan BuccoBelfi, Melanie, MD  meloxicam (MOBIC) 7.5 MG tablet Take 1 tablet (7.5 mg total) by mouth daily. 02/28/22   Rolan BuccoBelfi, Melanie, MD  OVER THE COUNTER MEDICATION Vitamin D3 800mg -Take 1 tablet by mouth twice a day. Patient not taking: Reported on 12/07/2021    [provider]  rosuvastatin (CRESTOR) 10 MG tablet Take 1 tablet (10 mg total) by mouth daily. 12/07/21   Everrett CoombeMatthews, Cody, DO      Allergies    Lisinopril    Review of Systems   Review of Systems  Gastrointestinal:  Positive for vomiting. Negative for abdominal pain and nausea.  Musculoskeletal:  Positive for gait problem.  Neurological:  Positive for dizziness and headaches. Negative for numbness.    Physical Exam Updated Vital Signs BP 125/87 (BP Location: Right Arm)   Pulse 87   Temp 97.7 F (36.5 C) (Oral)   Resp 16   SpO2 100%  Physical Exam Vitals and nursing note reviewed.  Constitutional:      Appearance: She is well-developed.  HENT:     Head: Normocephalic and atraumatic.  Eyes:     Extraocular Movements: Extraocular movements intact.     Pupils: Pupils are equal, round, and reactive to light.     Comments: There is some irregularly-shaped light erythema in her left periorbital region.  No tenderness or fluctuance.  No pain with extraocular movements.  Her left globe looks a little hazy but  otherwise normal.  She has no change in vision upon covering 1 eye versus the other.  Cardiovascular:     Rate and Rhythm: Normal rate and regular rhythm.     Heart sounds: Normal heart sounds.  Pulmonary:     Effort: Pulmonary effort is normal.     Breath sounds: Normal breath sounds.  Abdominal:     Palpations: Abdomen is soft.     Tenderness: There is no abdominal tenderness.  Skin:    General: Skin is warm and dry.  Neurological:     Mental Status: She is alert.     Comments: CN 3-12 grossly intact. 5/5 strength in all 4 extremities. Grossly normal sensation. Normal finger to nose. She was able to stand but when  walking is very off balance and fell back into the stretcher.     ED Results / Procedures / Treatments   Labs (all labs ordered are listed, but only abnormal results are displayed) Labs Reviewed  BASIC METABOLIC PANEL - Abnormal; Notable for the following components:      Result Value   Potassium 3.3 (*)    Glucose, Bld 143 (*)    Creatinine, Ser 1.10 (*)    GFR, Estimated 51 (*)    All other components within normal limits  URINALYSIS, ROUTINE W REFLEX MICROSCOPIC - Abnormal; Notable for the following components:   APPearance CLOUDY (*)    Ketones, ur 20 (*)    All other components within normal limits  CBG MONITORING, ED - Abnormal; Notable for the following components:   Glucose-Capillary 110 (*)    All other components within normal limits  CBG MONITORING, ED - Abnormal; Notable for the following components:   Glucose-Capillary 117 (*)    All other components within normal limits  RESP PANEL BY RT-PCR (FLU A&B, COVID) [id number]  HEPATIC FUNCTION PANEL  LIPASE, BLOOD  CBC WITH DIFFERENTIAL/PLATELET    EKG EKG Interpretation  Date/Time:  Sunday June 25 2022 13:53:54 EST Ventricular Rate:  73 PR Interval:  183 QRS Duration: 90 QT Interval:  422 QTC Calculation: 465 R Axis:   43 Text Interpretation: Sinus rhythm Probable left atrial enlargement no acute ST/T changes Confirmed by Pricilla Loveless [phone]) on 06/25/2022 2:00:21 PM  Radiology MR BRAIN WO CONTRAST  Result Date: 06/25/2022 CLINICAL DATA:  Right leg weakness EXAM: MRI HEAD WITHOUT CONTRAST TECHNIQUE: Multiplanar, multiecho pulse sequences of the brain and surrounding structures were obtained without intravenous contrast. COMPARISON:  Same-day CT brain FINDINGS: Brain: No acute infarction, hemorrhage, hydrocephalus, extra-axial collection or mass lesion. Vascular: Normal flow voids. Skull and upper cervical spine: Normal marrow signal. Sinuses/Orbits: Negative. Other: None. IMPRESSION: Negative for acute  infarct. No intracranial finding to explain right leg weakness. Electronically Signed   By: Lorenza Cambridge M.D.   On: 06/25/2022 13:32   CT Head Wo Contrast  Result Date: 06/25/2022 CLINICAL DATA:  Acute stroke suspected.  Periorbital cellulitis. EXAM: CT HEAD WITHOUT CONTRAST TECHNIQUE: Contiguous axial images were obtained from the base of the skull through the vertex without intravenous contrast. RADIATION DOSE REDUCTION: This exam was performed according to the departmental dose-optimization program which includes automated exposure control, adjustment of the mA and/or kV according to patient size and/or use of iterative reconstruction technique. COMPARISON:  CTA Head/Neck 09/24/21 FINDINGS: Brain: No evidence of acute infarction, hemorrhage, hydrocephalus, extra-axial collection or mass lesion/mass effect. Vascular: No hyperdense vessel or unexpected calcification. Skull: Normal. Negative for fracture or focal lesion. Sinuses/Orbits: See same day orbits for additional  findings. Paranasal sinuses are clear. Other: None. IMPRESSION: 1. No hemorrhage or CT evidence of an acute infarct. 2. See same day orbits for additional findings. Electronically Signed   By: Lorenza Cambridge M.D.   On: 06/25/2022 11:55   CT Orbits W Contrast  Result Date: 06/25/2022 CLINICAL DATA:  Periorbital cellulitis.  Stroke. EXAM: CT ORBITS WITH CONTRAST TECHNIQUE: Multidetector CT images was performed according to the standard protocol following intravenous contrast administration. RADIATION DOSE REDUCTION: This exam was performed according to the departmental dose-optimization program which includes automated exposure control, adjustment of the mA and/or kV according to patient size and/or use of iterative reconstruction technique. CONTRAST:  66mL OMNIPAQUE IOHEXOL 350 MG/ML SOLN COMPARISON:  09/24/2021 head CTA FINDINGS: Orbits: No orbital mass or evidence of inflammation. Normal appearance of the globes, optic nerve-sheath  complexes, extraocular muscles, orbital fat and lacrimal glands. Visible paranasal sinuses: Clear Soft tissues: No visible inflammation or collection. Osseous: Unremarkable. Limited intracranial: Negative IMPRESSION: Negative CT of the orbits. Electronically Signed   By: Tiburcio Pea M.D.   On: 06/25/2022 11:52    Procedures Procedures    Medications Ordered in ED Medications  ondansetron (ZOFRAN-ODT) disintegrating tablet 4 mg (4 mg Oral Given 06/25/22 0255)  dicyclomine (BENTYL) capsule 10 mg (10 mg Oral Given 06/25/22 0255)  lactated ringers bolus 1,000 mL (0 mLs Intravenous Stopped 06/25/22 1310)  iohexol (OMNIPAQUE) 350 MG/ML injection 75 mL (75 mLs Intravenous Contrast Given 06/25/22 1147)  meclizine (ANTIVERT) tablet 12.5 mg (12.5 mg Oral Given 06/25/22 1355)    ED Course/ Medical Decision Making/ A&P                           Medical Decision Making Amount and/or Complexity of Data Reviewed Labs:     Details: No UTI on urine.  Mildly low potassium but WBC and hemoglobin are normal. Radiology: ordered and independent interpretation performed.    Details: CT head without head bleed.  MRI without obvious stroke. ECG/medicine tests: independent interpretation performed.    Details: No acute ischemia or arrhythmia.  Risk Prescription drug management.   Based on presentation I suspect she has peripheral vertigo.  However given her significant difficulty with walking, MRI was obtained.  No signs of a stroke on MRI.  She was given a small dose of Antivert and now is able to ambulate.  She has a walker at home and so she feels comfortable going home.  I do not find any obvious concern for CNS infection based on presentation.  She was pretty hypertensive though this has improved.  She does appear to have some nonspecific redness that is probably periorbital cellulitis.  CT does not show any concern for orbital cellulitis.  We will give Augmentin and have her follow-up with  ophthalmology as well as her PCP.  Otherwise, she appears well for discharge.  She was given return precautions.        Final Clinical Impression(s) / ED Diagnoses Final diagnoses:  Vertigo  Periorbital cellulitis of left eye    Rx / DC Orders ED Discharge Orders          Ordered    meclizine (ANTIVERT) 12.5 MG tablet  3 times daily PRN        06/25/22 1430    amoxicillin-clavulanate (AUGMENTIN) 875-125 MG tablet  Every 12 hours        06/25/22 1430  Pricilla Loveless, MD 06/25/22 [phone]

## 2022-06-25 NOTE — ED Notes (Signed)
Brought pt back from waiting area for reassessment, she reports having left sided abdominal pain, and now having episode of vomiting and diarrhea.

## 2022-06-25 NOTE — Discharge Instructions (Addendum)
You are being prescribed antibiotics to help treat the eye infection.  You are also being given a medicine called Antivert to use if you develop recurrent vertigo/dizziness/trouble walking.  You develop severe headache, fever, vision changes, trouble walking, or any other new/concerning symptoms then return to the ER for evaluation.  Your blood pressure was very elevated today and you should get this rechecked by your primary care physician.  Make sure you are taking your blood pressure medicines appropriately.

## 2022-06-26 ENCOUNTER — Telehealth: Payer: Self-pay | Admitting: General Practice

## 2022-06-26 NOTE — Telephone Encounter (Signed)
Transition Care Management Unsuccessful Follow-up Telephone Call  Date of discharge and from where:  06/25/22 from [facility] [place]  Attempts:  1st Attempt  Reason for unsuccessful TCM follow-up call:  Unable to reach patient Patient's sister answered the phone and stated that the patient is not available.

## 2022-06-28 NOTE — Telephone Encounter (Signed)
Transition Care Management Unsuccessful Follow-up Telephone Call  Date of discharge and from where:  06/25/22 from [facility]  Attempts:  2nd Attempt  Reason for unsuccessful TCM follow-up call:  No answer/busy

## 2022-07-03 NOTE — Telephone Encounter (Signed)
Transition Care Management Unsuccessful Follow-up Telephone Call  Date of discharge and from where:  06/25/22 from [facility]  Attempts:  3rd Attempt  Reason for unsuccessful TCM follow-up call:  No answer/busy

## 2022-10-18 ENCOUNTER — Other Ambulatory Visit: Payer: Self-pay | Admitting: Pharmacist

## 2022-10-18 NOTE — Progress Notes (Signed)
Patient appearing on report for [employer] - Hypertension Control report due to last documented ambulatory blood pressure of 155/76 on 06/25/22. Next appointment with PCP is not scheduled.   Outreached patient to discuss hypertension control and medication management. Left voicemail for patient to return my call at their convenience.    Larinda Buttery, PharmD Clinical Pharmacist [facility] [phone]

## 2023-01-16 DIAGNOSIS — M25552 Pain in left hip: Secondary | ICD-10-CM | POA: Insufficient documentation

## 2023-06-19 ENCOUNTER — Ambulatory Visit: Payer: Federal, State, Local not specified - PPO | Admitting: Family Medicine

## 2023-06-19 ENCOUNTER — Encounter: Payer: Self-pay | Admitting: Family Medicine

## 2023-06-19 VITALS — BP 164/99 | HR 83 | Temp 97.5°F | Resp 18 | Ht 64.0 in | Wt 167.0 lb

## 2023-06-19 DIAGNOSIS — I10 Essential (primary) hypertension: Secondary | ICD-10-CM | POA: Diagnosis not present

## 2023-06-19 DIAGNOSIS — Z136 Encounter for screening for cardiovascular disorders: Secondary | ICD-10-CM

## 2023-06-19 DIAGNOSIS — Z0000 Encounter for general adult medical examination without abnormal findings: Secondary | ICD-10-CM

## 2023-06-19 DIAGNOSIS — R7302 Impaired glucose tolerance (oral): Secondary | ICD-10-CM

## 2023-06-19 DIAGNOSIS — E782 Mixed hyperlipidemia: Secondary | ICD-10-CM

## 2023-06-19 DIAGNOSIS — Z7689 Persons encountering health services in other specified circumstances: Secondary | ICD-10-CM

## 2023-06-19 DIAGNOSIS — Z13220 Encounter for screening for lipoid disorders: Secondary | ICD-10-CM

## 2023-06-19 NOTE — Progress Notes (Signed)
'  New Patient Office Visit and Physical  Subjective    Patient ID: [facility], female    DOB: Nov 23, 1940  Age: 82 y.o. MRN: [id number]  CC:  Chief Complaint  Patient presents with   Establish Care    Patient is here to establish care with new PCP, and to discuss overall health     HPI [facility] presents to establish care and CPE. Pt has hx of HTN. She is taking Amlodipine 10 mg daily. Pt has hx of HLD and is taking Crestor 20mg  at night.  She is wanting her CPE today. Not eating red meat. She does eat healthy. She goes to the gym 4 times a week. Sleep is ok. She declines flu vaccine.  Outpatient Encounter Medications as of 06/19/2023  Medication Sig   amLODipine (NORVASC) 10 MG tablet Take 1 tablet (10 mg total) by mouth daily.   losartan (COZAAR) 25 MG tablet Take 25 mg by mouth daily.   [DISCONTINUED] rosuvastatin (CRESTOR) 20 MG tablet Take 20 mg by mouth at bedtime.   [DISCONTINUED] amoxicillin-clavulanate (AUGMENTIN) 875-125 MG tablet Take 1 tablet by mouth every 12 (twelve) hours.   [DISCONTINUED] lidocaine (LIDODERM) 5 % Place 1 patch onto the skin daily. Remove & Discard patch within 12 hours or as directed by MD   [DISCONTINUED] meclizine (ANTIVERT) 12.5 MG tablet Take 1 tablet (12.5 mg total) by mouth 3 (three) times daily as needed for dizziness.   [DISCONTINUED] meloxicam (MOBIC) 7.5 MG tablet Take 1 tablet (7.5 mg total) by mouth daily.   [DISCONTINUED] OVER THE COUNTER MEDICATION Vitamin D3 800mg -Take 1 tablet by mouth twice a day. (Patient not taking: Reported on 12/07/2021)   [DISCONTINUED] rosuvastatin (CRESTOR) 10 MG tablet Take 1 tablet (10 mg total) by mouth daily.   No facility-administered encounter medications on file as of 06/19/2023.    Past Medical History:  Diagnosis Date   Essential hypertension, benign 08/18/2013   Family hx of colon cancer    High cholesterol    Systolic ejection murmur 01/18/2017    Past Surgical History:   Procedure Laterality Date   NO PAST SURGERIES      Family History  Problem Relation Age of Onset   Colon cancer Sister    Colon cancer Brother    Diabetes Other    Hyperlipidemia Other    Hypertension Other    Stroke Neg Hx     Social History   Socioeconomic History   Marital status: Divorced    Spouse name: Not on file   Number of children: Not on file   Years of education: Not on file   Highest education level: Not on file  Occupational History   Not on file  Tobacco Use   Smoking status: Never    Passive exposure: Never   Smokeless tobacco: Never  Vaping Use   Vaping status: Never Used  Substance and Sexual Activity   Alcohol use: Yes    Comment: occ   Drug use: Never   Sexual activity: Never    Partners: Female  Other Topics Concern   Not on file  Social History Narrative   ** Merged History Encounter **       Social Determinants of Health   Financial Resource Strain: Not on file  Food Insecurity: No Food Insecurity (08/17/2021)   Received from [facility], [facility]   Hunger Vital Sign    Worried About Running Out of Food in the Last Year: Never true  Ran Out of Food in the Last Year: Never true  Transportation Needs: Not on file  Physical Activity: Not on file  Stress: Not on file  Social Connections: Unknown (12/15/2021)   Received from [facility], [facility]   Social Network    Social Network: Not on file  Intimate Partner Violence: Unknown (11/10/2021)   Received from [facility], [facility]   HITS    Physically Hurt: Not on file    Insult or Talk Down To: Not on file    Threaten Physical Harm: Not on file    Scream or Curse: Not on file    Review of Systems  All other systems reviewed and are negative.      Objective    BP (!) 153/80   Pulse 83   Temp (!) 97.5 F (36.4 C) (Oral)   Resp 18   Ht 5\' 4"  (1.626 m)   Wt 167 lb (75.8 kg)   SpO2 96%   BMI 28.67 kg/m   Physical Exam Vitals and nursing note  reviewed.  Constitutional:      Appearance: Normal appearance. She is normal weight.  HENT:     Head: Normocephalic and atraumatic.     Right Ear: Tympanic membrane, ear canal and external ear normal.     Left Ear: Tympanic membrane, ear canal and external ear normal.     Nose: Nose normal.     Mouth/Throat:     Mouth: Mucous membranes are moist.     Pharynx: Oropharynx is clear.  Eyes:     Conjunctiva/sclera: Conjunctivae normal.     Pupils: Pupils are equal, round, and reactive to light.  Cardiovascular:     Rate and Rhythm: Normal rate and regular rhythm.     Pulses: Normal pulses.     Heart sounds: Normal heart sounds.  Pulmonary:     Effort: Pulmonary effort is normal.     Breath sounds: Normal breath sounds.  Abdominal:     General: Abdomen is flat. Bowel sounds are normal.  Skin:    General: Skin is warm.     Capillary Refill: Capillary refill takes less than 2 seconds.  Neurological:     General: No focal deficit present.     Mental Status: She is alert and oriented to person, place, and time. Mental status is at baseline.  Psychiatric:        Mood and Affect: Mood normal.        Behavior: Behavior normal.        Thought Content: Thought content normal.        Judgment: Judgment normal.       Assessment & Plan:   Problem List Items Addressed This Visit   None Annual physical exam  Encounter to establish care with new doctor  Primary hypertension  Mixed hyperlipidemia  Encounter for lipid screening for cardiovascular disease -     Lipid panel  Impaired glucose tolerance -     CBC with Differential/Platelet -     Comprehensive metabolic panel -     Hemoglobin A1c   Screening labs Declines flu vaccine See back in 6 months sooner prn.  No follow-ups on file.   Suzan Slick, MD

## 2023-06-20 LAB — CBC WITH DIFFERENTIAL/PLATELET
Basophils Absolute: 0 10*3/uL (ref 0.0–0.2)
Basos: 1 %
EOS (ABSOLUTE): 0 10*3/uL (ref 0.0–0.4)
Eos: 1 %
Hematocrit: 39.1 % (ref 34.0–46.6)
Hemoglobin: 12.6 g/dL (ref 11.1–15.9)
Immature Grans (Abs): 0 10*3/uL (ref 0.0–0.1)
Immature Granulocytes: 0 %
Lymphocytes Absolute: 1.5 10*3/uL (ref 0.7–3.1)
Lymphs: 40 %
MCH: 29.4 pg (ref 26.6–33.0)
MCHC: 32.2 g/dL (ref 31.5–35.7)
MCV: 91 fL (ref 79–97)
Monocytes Absolute: 0.2 10*3/uL (ref 0.1–0.9)
Monocytes: 7 %
Neutrophils Absolute: 1.9 10*3/uL (ref 1.4–7.0)
Neutrophils: 51 %
Platelets: 257 10*3/uL (ref 150–450)
RBC: 4.29 x10E6/uL (ref 3.77–5.28)
RDW: 13.6 % (ref 11.7–15.4)
WBC: 3.6 10*3/uL (ref 3.4–10.8)

## 2023-06-20 LAB — COMPREHENSIVE METABOLIC PANEL
ALT: 10 [IU]/L (ref 0–32)
AST: 22 [IU]/L (ref 0–40)
Albumin: 4.2 g/dL (ref 3.8–4.8)
Alkaline Phosphatase: 69 [IU]/L (ref 44–121)
BUN/Creatinine Ratio: 8 — ABNORMAL LOW (ref 12–28)
BUN: 9 mg/dL (ref 8–27)
Bilirubin Total: 0.5 mg/dL (ref 0.0–1.2)
CO2: 23 mmol/L (ref 20–29)
Calcium: 9.5 mg/dL (ref 8.7–10.3)
Chloride: 107 mmol/L — ABNORMAL HIGH (ref 96–106)
Creatinine, Ser: 1.07 mg/dL — ABNORMAL HIGH (ref 0.57–1.00)
Globulin, Total: 2.1 g/dL (ref 1.5–4.5)
Glucose: 94 mg/dL (ref 70–99)
Potassium: 4.3 mmol/L (ref 3.5–5.2)
Sodium: 146 mmol/L — ABNORMAL HIGH (ref 134–144)
Total Protein: 6.3 g/dL (ref 6.0–8.5)
eGFR: 53 mL/min/{1.73_m2} — ABNORMAL LOW (ref >59)

## 2023-06-20 LAB — HEMOGLOBIN A1C
Est. average glucose Bld gHb Est-mCnc: 120 mg/dL
Hgb A1c MFr Bld: 5.8 % — ABNORMAL HIGH (ref 4.8–5.6)

## 2023-06-20 LAB — LIPID PANEL
Chol/HDL Ratio: 2 ratio (ref 0.0–4.4)
Cholesterol, Total: 218 mg/dL — ABNORMAL HIGH (ref 100–199)
HDL: 109 mg/dL (ref >39)
LDL Chol Calc (NIH): 100 mg/dL — ABNORMAL HIGH (ref 0–99)
Triglycerides: 50 mg/dL (ref 0–149)
VLDL Cholesterol Cal: 9 mg/dL (ref 5–40)

## 2023-08-22 IMAGING — CT CT ANGIO HEAD-NECK (W OR W/O PERF)
2 of 14 series · 5 of 33 positions shown · IV contrast (Omnipaque)
Comparison: None.

CLINICAL DATA: Headache, sudden, severe Subarachnoid hemorrhage
(TIGER)

EXAM:
CT ANGIOGRAPHY HEAD AND NECK
TECHNIQUE: Multidetector CT imaging of the head and neck was performed using
the standard protocol during bolus administration of intravenous
contrast. Multiplanar CT image reconstructions and MIPs were
obtained to evaluate the vascular anatomy. Carotid stenosis
measurements (when applicable) are obtained utilizing NASCET
criteria, using the distal internal carotid diameter as the
denominator.

[Series 10: axial thin · axial · 0.56mm/px · z∈[-668,-326]mm · 3 of 343 slices shown]
[im 1/343  soft-tissue]
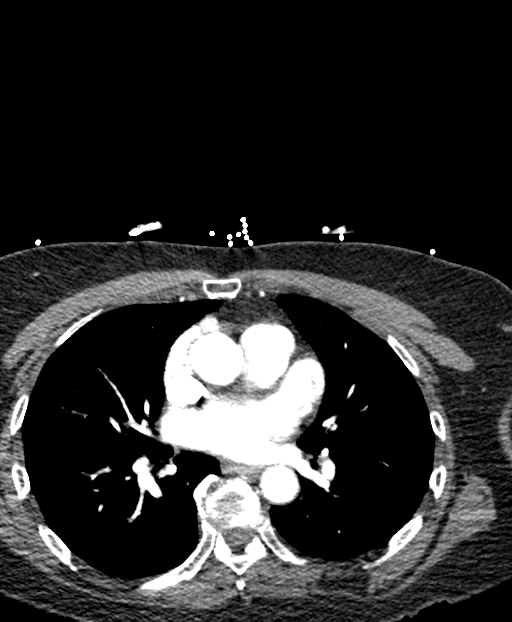
[im 172/343  bone]
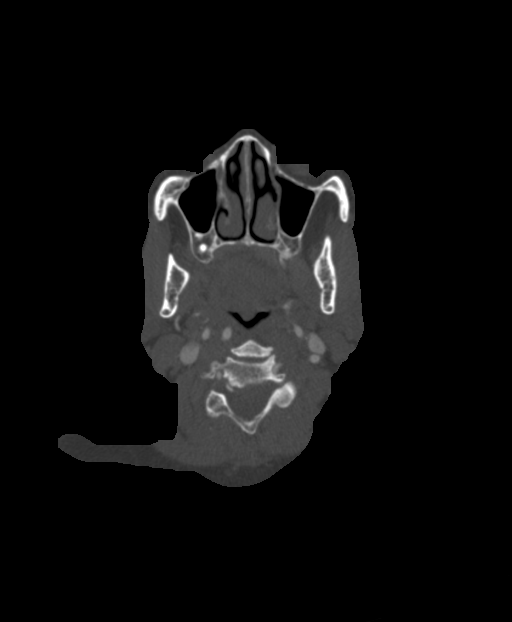
[im 343/343  soft-tissue]
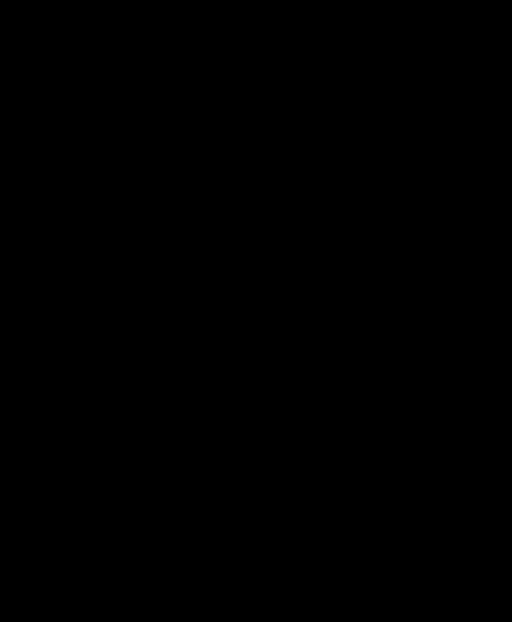

[Series 19: axial thin 2 · axial · 0.63mm/px · z∈[-602,-507]mm · 2 of 349 slices shown]
[im 117/349  soft-tissue]
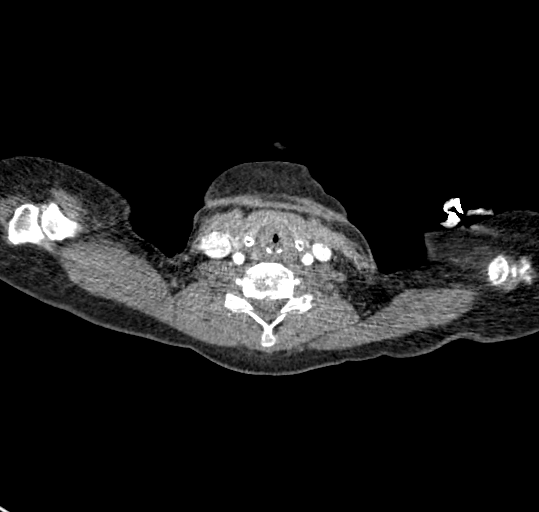
[im 233/349  soft-tissue]
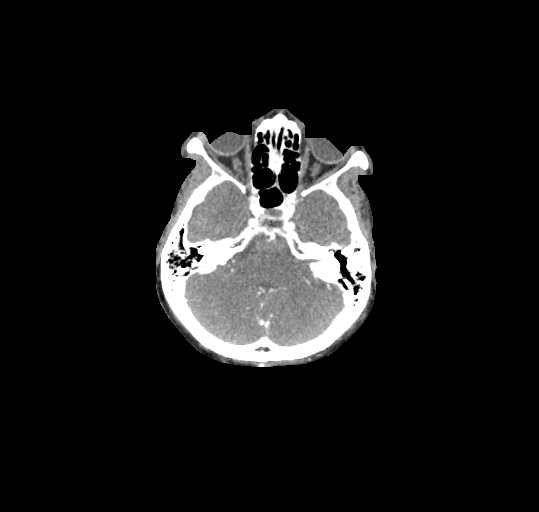

[5 of 33 positions shown; findings below may reference images not displayed]

RADIATION DOSE REDUCTION: This exam was performed according to the
departmental dose-optimization program which includes automated
exposure control, adjustment of the mA and/or kV according to
patient size and/or use of iterative reconstruction technique.

CONTRAST:  75mL OMNIPAQUE IOHEXOL 350 MG/ML SOLN
FINDINGS: CT HEAD FINDINGS

Brain: No evidence of acute infarction, hemorrhage, hydrocephalus,
extra-axial collection or mass lesion/mass effect.

Vascular: No hyperdense vessel identified.

Skull: No acute fracture

Sinuses: Visualized sinuses are clear.

Orbits: No acute finding.

Review of the MIP images confirms the above findings

CTA NECK FINDINGS

Aortic arch: Great vessel origins are patent without significant
stenosis.

Right carotid system: Mild atherosclerosis at the carotid
bifurcation without significant (greater than 50%) stenosis. Mild
irregularity at the skull base. Tortuous ICA at the skull base.

Left carotid system: Mild atherosclerosis at the carotid bifurcation
without significant (greater than 50%) stenosis.

Vertebral arteries: Bilateral vertebral arteries are patent without
significant (greater than 50%) stenosis.

Skeleton: Moderate multilevel degenerative change.

Other neck: No evidence of acute abnormality on limited assessment.

Upper chest: Visualized lung apices are clear.

Review of the MIP images confirms the above findings

CTA HEAD FINDINGS

Anterior circulation: Calcific atherosclerosis of bilateral
intracranial ICAs without significant stenosis bilateral MCAs and
ACAs are patent without proximal hemodynamically significant
stenosis. Distal MCAs are difficult to follow due to venous
contamination.

Posterior circulation: Left dominant intradural vertebral artery.
Bilateral intradural vertebral arteries and basilar artery are
patent without significant stenosis. Small P1 PCAs bilaterally with
posterior communicating arteries, anatomic variant. Bilateral PCAs
are patent without proximal hemodynamically significant stenosis.
Distal PCAs are difficult to visualize due to venous contamination.

Venous sinuses: As permitted by contrast timing, patent.

Anatomic variants: Detailed above.

Review of the MIP images confirms the above findings
IMPRESSION: CT head:

No evidence of acute intracranial abnormality.

CTA head:

Study limited by venous timing/contamination without large vessel
occlusion or proximal hemodynamically significant stenosis.

CTA neck:

1. No significant (greater than 50%) stenosis.
2. Mild irregularity of bilateral ICAs at the skull base,
potentially related to fibromuscular dysplasia or atherosclerosis.

## 2023-08-23 ENCOUNTER — Ambulatory Visit: Payer: Federal, State, Local not specified - PPO | Admitting: Family Medicine

## 2023-08-23 ENCOUNTER — Encounter: Payer: Self-pay | Admitting: Family Medicine

## 2023-08-23 VITALS — BP 156/83 | HR 74 | Temp 97.6°F | Resp 18 | Ht 64.0 in | Wt 165.4 lb

## 2023-08-23 DIAGNOSIS — I10 Essential (primary) hypertension: Secondary | ICD-10-CM

## 2023-08-23 DIAGNOSIS — M2550 Pain in unspecified joint: Secondary | ICD-10-CM

## 2023-08-23 MED ORDER — DICLOFENAC SODIUM 1 % EX GEL: 2.0000 g | Freq: Four times a day (QID) | CUTANEOUS | 1 refills | Status: DC | PRN

## 2023-08-23 MED ORDER — AMLODIPINE BESYLATE 10 MG PO TABS: 10.0000 mg | ORAL_TABLET | Freq: Every day | ORAL | 1 refills | Status: DC

## 2023-08-23 NOTE — Patient Instructions (Signed)
Tylenol arthritis over the counter

## 2023-08-23 NOTE — Progress Notes (Signed)
Established Patient Office Visit  Subjective   Patient ID: Felicia Yu, female    DOB: 1941/07/07  Age: 83 y.o. MRN: [id number]  Chief Complaint  Patient presents with   Referral    Patient is here to discuss possibly getting a referral for Rheumatology, She states that she has bad arthritis and would like to know if she has to take any medication   Medication Refill    Patient states that she needs a refill on her Amlodipine    Medication Refill    Arthralgia Pt reports it's all over her body. She reports the hands are the worse. She has bony nodules on her fingers. Denies swelling. She also reports her knees also are painful. She has good days and bad days. She reports it's been like this for a while for the last year.  She doesn't want to take any medicines. She is at the point where she needs something for the pain.  Hypertension Pt is needing refills on her Amlodipine 10 mg daily. Denies chest pains or SOB   Review of Systems  Musculoskeletal:  Positive for joint pain.  All other systems reviewed and are negative.     Objective:     BP (!) 156/83   Pulse 74   Temp 97.6 F (36.4 C) (Oral)   Resp 18   Ht 5\' 4"  (1.626 m)   Wt 165 lb 6.4 oz (75 kg)   SpO2 100%   BMI 28.39 kg/m  BP Readings from Last 3 Encounters:  08/23/23 (!) 156/83  06/19/23 (!) 164/99  06/25/22 125/87      Physical Exam Vitals and nursing note reviewed.  Constitutional:      Appearance: Normal appearance. She is normal weight.  HENT:     Head: Normocephalic and atraumatic.     Right Ear: Tympanic membrane, ear canal and external ear normal.     Left Ear: Tympanic membrane, ear canal and external ear normal.     Nose: Nose normal.     Mouth/Throat:     Mouth: Mucous membranes are moist.     Pharynx: Oropharynx is clear.  Eyes:     Conjunctiva/sclera: Conjunctivae normal.     Pupils: Pupils are equal, round, and reactive to light.  Cardiovascular:     Rate and Rhythm:  Normal rate and regular rhythm.     Pulses: Normal pulses.     Heart sounds: Normal heart sounds.  Pulmonary:     Effort: Pulmonary effort is normal.     Breath sounds: Normal breath sounds.  Abdominal:     General: Abdomen is flat. Bowel sounds are normal.  Skin:    General: Skin is warm.     Capillary Refill: Capillary refill takes less than 2 seconds.  Neurological:     General: No focal deficit present.     Mental Status: She is alert and oriented to person, place, and time. Mental status is at baseline.  Psychiatric:        Mood and Affect: Mood normal.        Behavior: Behavior normal.        Thought Content: Thought content normal.        Judgment: Judgment normal.      No results found for any visits on 08/23/23.     The ASCVD Risk score (Arnett DK, et al., 2019) failed to calculate for the following reasons:   The 2019 ASCVD risk score is only valid for ages 70  to 10    Assessment & Plan:   Problem List Items Addressed This Visit   None  Arthralgia, unspecified joint -     Diclofenac Sodium; Apply 2 g topically 4 (four) times daily as needed.  Dispense: 100 g; Refill: 1  Primary hypertension -     amLODIPine Besylate; Take 1 tablet (10 mg total) by mouth daily.  Dispense: 90 tablet; Refill: 1   Worsening arthralgia but hasn't tried anything for the pain. She reports she's scared of some of the medicines affecting her kidneys. Have advised to try over the counter tylenol arthritis. Have also sent in Voltaren gel to use 4 times a day prn. For HTN, refilled blood pressure medicine Amlodipine 10mg  daily. No follow-ups on file.    Suzan Slick, MD

## 2023-12-17 ENCOUNTER — Encounter: Payer: Self-pay | Admitting: Family Medicine

## 2023-12-17 ENCOUNTER — Ambulatory Visit: Payer: Federal, State, Local not specified - PPO | Admitting: Family Medicine

## 2023-12-17 VITALS — BP 149/66 | HR 71 | Temp 97.9°F | Resp 18 | Ht 64.0 in | Wt 160.5 lb

## 2023-12-17 DIAGNOSIS — E782 Mixed hyperlipidemia: Secondary | ICD-10-CM | POA: Diagnosis not present

## 2023-12-17 DIAGNOSIS — R7303 Prediabetes: Secondary | ICD-10-CM

## 2023-12-17 DIAGNOSIS — I10 Essential (primary) hypertension: Secondary | ICD-10-CM

## 2023-12-17 DIAGNOSIS — M25552 Pain in left hip: Secondary | ICD-10-CM

## 2023-12-17 MED ORDER — AMLODIPINE BESYLATE 10 MG PO TABS: 10.0000 mg | ORAL_TABLET | Freq: Every day | ORAL | 1 refills | Status: DC

## 2023-12-17 MED ORDER — ROSUVASTATIN CALCIUM 20 MG PO TABS: 20.0000 mg | ORAL_TABLET | Freq: Every day | ORAL | 1 refills | Status: AC

## 2023-12-17 NOTE — Progress Notes (Signed)
 Established Patient Office Visit  Subjective   Patient ID: Felicia Yu  [name], female    DOB: 10-10-1940  Age: 83 y.o. MRN: [id number]  Chief Complaint  Patient presents with   Follow-up    Patient is here for a 6 month follow up. Patient states that she does need refills on both of her medications .    HPI  Hypertension Pt is taking Amlodipine  10mg  daily. Working well with her. She needs this refilled.   HLD She is taking Crestor  20mg  daily. Needs this refilled.   Prediabetes Pt with recent physical back in Nov. Noted to have elevated A1c at  5.8. here for recheck.   Left hip pain Pt with bilateral hip pain but the left hip is worse. Laying on it and walking is making it worse. She would like to see Ortho. She hasn't tried anything for it.     Review of Systems  Musculoskeletal:  Positive for joint pain.  All other systems reviewed and are negative.    Objective:     BP (!) 149/66   Pulse 71   Temp 97.9 F (36.6 C) (Oral)   Resp 18   Ht 5\' 4"  (1.626 m)   Wt 160 lb 8 oz (72.8 kg)   SpO2 99%   BMI 27.55 kg/m  BP Readings from Last 3 Encounters:  12/17/23 (!) 149/66  08/23/23 (!) 156/83  06/19/23 (!) 164/99      Physical Exam Vitals and nursing note reviewed.  Constitutional:      Appearance: Normal appearance. She is normal weight.  HENT:     Head: Normocephalic and atraumatic.     Right Ear: External ear normal.     Left Ear: External ear normal.     Nose: Nose normal.     Mouth/Throat:     Mouth: Mucous membranes are moist.     Pharynx: Oropharynx is clear.  Eyes:     Conjunctiva/sclera: Conjunctivae normal.     Pupils: Pupils are equal, round, and reactive to light.  Cardiovascular:     Rate and Rhythm: Normal rate and regular rhythm.     Pulses: Normal pulses.     Heart sounds: Normal heart sounds.  Pulmonary:     Effort: Pulmonary effort is normal.     Breath sounds: Normal breath sounds.  Skin:    General: Skin is warm.     Capillary  Refill: Capillary refill takes less than 2 seconds.  Neurological:     General: No focal deficit present.     Mental Status: She is alert and oriented to person, place, and time. Mental status is at baseline.  Psychiatric:        Mood and Affect: Mood normal.        Behavior: Behavior normal.        Thought Content: Thought content normal.        Judgment: Judgment normal.    No results found for any visits on 12/17/23.  Last metabolic panel Lab Results  Component Value Date   GLUCOSE 94 06/19/2023   NA 146 (H) 06/19/2023   K 4.3 06/19/2023   CL 107 (H) 06/19/2023   CO2 23 06/19/2023   BUN 9 06/19/2023   CREATININE 1.07 (H) 06/19/2023   EGFR 53 (L) 06/19/2023   CALCIUM  9.5 06/19/2023   PHOS 3.5 07/11/2013   PROT 6.3 06/19/2023   ALBUMIN 4.2 06/19/2023   LABGLOB 2.1 06/19/2023   BILITOT 0.5 06/19/2023   ALKPHOS 69 06/19/2023  AST 22 06/19/2023   ALT 10 06/19/2023   ANIONGAP 12 06/24/2022   Last lipids Lab Results  Component Value Date   CHOL 218 (H) 06/19/2023   HDL 109 06/19/2023   LDLCALC 100 (H) 06/19/2023   TRIG 50 06/19/2023   CHOLHDL 2.0 06/19/2023   Last hemoglobin A1c Lab Results  Component Value Date   HGBA1C 5.8 (H) 06/19/2023      The ASCVD Risk score (Arnett DK, et al., 2019) failed to calculate for the following reasons:   The 2019 ASCVD risk score is only valid for ages 31 to 57    Assessment & Plan:   Problem List Items Addressed This Visit       Other   Prediabetes (Chronic)   Hyperlipidemia (Chronic)   Relevant Medications   rosuvastatin  (CRESTOR ) 20 MG tablet   amLODipine  (NORVASC ) 10 MG tablet   Other Visit Diagnoses       Primary hypertension    -  Primary   Relevant Medications   rosuvastatin  (CRESTOR ) 20 MG tablet   amLODipine  (NORVASC ) 10 MG tablet      Primary hypertension -     amLODIPine  Besylate; Take 1 tablet (10 mg total) by mouth daily.  Dispense: 90 tablet; Refill: 1 -     Comprehensive metabolic panel with  GFR  Mixed hyperlipidemia -     Rosuvastatin  Calcium ; Take 1 tablet (20 mg total) by mouth daily.  Dispense: 90 tablet; Refill: 1 -     Lipid panel  Prediabetes -     Hemoglobin A1c  Pain of left hip joint   Pt with chronic HTN and HLD. Stable and controlled. Recheck CMP and lipid panel. Fasting today. Refilled chronic medicines listed above. Pt with prediabetes recheck today. With ongoing left hip pain worsening. Advised to try Tylenol arthritis and/or voltaren  gel topically. If worsening despite treatments, pt in agreement for referral to Ortho  No follow-ups on file.    Manette Section, MD

## 2023-12-17 NOTE — Patient Instructions (Signed)
 Tylenol arthritis pills Voltaren  over the counter gel

## 2023-12-18 ENCOUNTER — Ambulatory Visit: Payer: Self-pay | Admitting: Family Medicine

## 2023-12-18 LAB — COMPREHENSIVE METABOLIC PANEL WITH GFR
ALT: 12 IU/L (ref 0–32)
AST: 24 IU/L (ref 0–40)
Albumin: 4.5 g/dL (ref 3.7–4.7)
Alkaline Phosphatase: 74 IU/L (ref 44–121)
BUN/Creatinine Ratio: 12 (ref 12–28)
BUN: 13 mg/dL (ref 8–27)
Bilirubin Total: 0.3 mg/dL (ref 0.0–1.2)
CO2: 22 mmol/L (ref 20–29)
Calcium: 9.9 mg/dL (ref 8.7–10.3)
Chloride: 105 mmol/L (ref 96–106)
Creatinine, Ser: 1.08 mg/dL — ABNORMAL HIGH (ref 0.57–1.00)
Globulin, Total: 2.2 g/dL (ref 1.5–4.5)
Glucose: 87 mg/dL (ref 70–99)
Potassium: 4.1 mmol/L (ref 3.5–5.2)
Sodium: 144 mmol/L (ref 134–144)
Total Protein: 6.7 g/dL (ref 6.0–8.5)
eGFR: 52 mL/min/{1.73_m2} — ABNORMAL LOW (ref >59)

## 2023-12-18 LAB — HEMOGLOBIN A1C
Est. average glucose Bld gHb Est-mCnc: 117 mg/dL
Hgb A1c MFr Bld: 5.7 % — ABNORMAL HIGH (ref 4.8–5.6)

## 2023-12-18 LAB — LIPID PANEL
Chol/HDL Ratio: 1.9 ratio (ref 0.0–4.4)
Cholesterol, Total: 212 mg/dL — ABNORMAL HIGH (ref 100–199)
HDL: 112 mg/dL (ref >39)
LDL Chol Calc (NIH): 89 mg/dL (ref 0–99)
Triglycerides: 59 mg/dL (ref 0–149)
VLDL Cholesterol Cal: 11 mg/dL (ref 5–40)

## 2024-08-17 ENCOUNTER — Other Ambulatory Visit: Payer: Self-pay | Admitting: Family Medicine

## 2024-08-17 DIAGNOSIS — I10 Essential (primary) hypertension: Secondary | ICD-10-CM
